# Patient Record
Sex: Female | Born: 1995 | Race: White | Hispanic: No | Marital: Single | Smoking: Never smoker
Health system: Southern US, Community
[De-identification: ages and names within clinical notes are randomized; demographics above are authoritative.]

## PROBLEM LIST (undated history)

## (undated) DIAGNOSIS — F32A Depression, unspecified: Secondary | ICD-10-CM

## (undated) DIAGNOSIS — F419 Anxiety disorder, unspecified: Secondary | ICD-10-CM

## (undated) DIAGNOSIS — F909 Attention-deficit hyperactivity disorder, unspecified type: Secondary | ICD-10-CM

## (undated) DIAGNOSIS — M722 Plantar fascial fibromatosis: Secondary | ICD-10-CM

## (undated) DIAGNOSIS — Z973 Presence of spectacles and contact lenses: Secondary | ICD-10-CM

## (undated) DIAGNOSIS — F319 Bipolar disorder, unspecified: Secondary | ICD-10-CM

## (undated) HISTORY — DX: Presence of spectacles and contact lenses: Z97.3

## (undated) HISTORY — DX: Plantar fascial fibromatosis: M72.2

## (undated) HISTORY — PX: LEG SURGERY: SHX1003

## (undated) HISTORY — DX: Depression, unspecified: F32.A

## (undated) HISTORY — DX: Attention-deficit hyperactivity disorder, unspecified type: F90.9

## (undated) HISTORY — DX: Anxiety disorder, unspecified: F41.9

---
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
## 2017-03-27 ENCOUNTER — Ambulatory Visit: Payer: BC Managed Care – PPO | Admitting: Internal Medicine

## 2017-03-27 ENCOUNTER — Encounter: Payer: Self-pay | Admitting: Internal Medicine

## 2017-03-27 VITALS — BP 124/82 | HR 72 | Temp 98.7°F | Ht 65.5 in | Wt 232.0 lb

## 2017-03-27 DIAGNOSIS — Z3009 Encounter for other general counseling and advice on contraception: Secondary | ICD-10-CM

## 2017-03-27 NOTE — Progress Notes (Signed)
HPI  Pt presents to the clinic today to establish care. She has not had a PCP in many years. She would like to discuss options for birth control today. She is sexually active. Her periods are regular and light. She has no history of clotting disorders. She does not smoke. She has done some research on her own, and would like to know more about the implant.  Flu: never Tetanus: ? 2012 Pap Smear: never Dentist: biannually  Past Medical History:  Diagnosis Date  . Wears glasses     No current outpatient medications on file.   No current facility-administered medications for this visit.     No Known Allergies  Family History  Problem Relation Age of Onset  . Fibrocystic breast disease Mother   . Dementia Paternal Grandmother     Social History   Socioeconomic History  . Marital status: Single    Spouse name: Not on file  . Number of children: Not on file  . Years of education: Not on file  . Highest education level: Not on file  Social Needs  . Financial resource strain: Not on file  . Food insecurity - worry: Not on file  . Food insecurity - inability: Not on file  . Transportation needs - medical: Not on file  . Transportation needs - non-medical: Not on file  Occupational History  . Not on file  Tobacco Use  . Smoking status: Never Smoker  . Smokeless tobacco: Never Used  Substance and Sexual Activity  . Alcohol use: No    Frequency: Never  . Drug use: No  . Sexual activity: Not on file  Other Topics Concern  . Not on file  Social History Narrative  . Not on file    ROS:  Constitutional: Denies fever, malaise, fatigue, headache or abrupt weight changes.  HEENT: Denies eye pain, eye redness, ear pain, ringing in the ears, wax buildup, runny nose, nasal congestion, bloody nose, or sore throat. Respiratory: Denies difficulty breathing, shortness of breath, cough or sputum production.   Cardiovascular: Denies chest pain, chest tightness, palpitations or  swelling in the hands or feet.  Gastrointestinal: Denies abdominal pain, bloating, constipation, diarrhea or blood in the stool.  GU: Denies frequency, urgency, pain with urination, blood in urine, odor or discharge. Musculoskeletal: Denies decrease in range of motion, difficulty with gait, muscle pain or joint pain and swelling.  Skin: Denies redness, rashes, lesions or ulcercations.  Neurological: Denies dizziness, difficulty with memory, difficulty with speech or problems with balance and coordination.  Psych: Denies anxiety, depression, SI/HI.  No other specific complaints in a complete review of systems (except as listed in HPI above).  PE:  BP 124/82   Pulse 72   Temp 98.7 F (37.1 C) (Oral)   Ht 5' 5.5" (1.664 m)   Wt 232 lb (105.2 kg)   LMP 03/11/2017   SpO2 98%   BMI 38.02 kg/m   Wt Readings from Last 3 Encounters:  03/27/17 232 lb (105.2 kg)  07/28/12 191 lb (86.6 kg) (97 %, Z= 1.86)*   * Growth percentiles are based on CDC (Girls, 2-20 Years) data.    General: Appears her stated age, obese, in NAD. Cardiovascular: Normal rate and rhythm. S1,S2 noted.  No murmur, rubs or gallops noted.  Pulmonary/Chest: Normal effort and positive vesicular breath sounds. No respiratory distress. No wheezes, rales or ronchi noted.  Abdomen: Soft and nontender. Normal bowel sounds. No distention or masses noted.  Neurological: Alert and oriented.  Psychiatric: Mood and affect normal. Behavior is normal. Judgment and thought content normal.     Assessment and Plan:  Encounter for Birth Control Counseling:  Discussed OCP's, implant, intravaginal ring, injectables and implants Discussed common side effects of each and how each prevent pregnancy She would like the implant, which we do not do here Referral placed to GYN- you will receive a call to set this up  Advised her to make an appt for her annual exam Nicki Reaper, NP

## 2017-03-27 NOTE — Patient Instructions (Signed)
Etonogestrel implant What is this medicine? ETONOGESTREL (et oh noe JES trel) is a contraceptive (birth control) device. It is used to prevent pregnancy. It can be used for up to 3 years. This medicine may be used for other purposes; ask your health care provider or pharmacist if you have questions. COMMON BRAND NAME(S): Implanon, Nexplanon What should I tell my health care provider before I take this medicine? They need to know if you have any of these conditions: -abnormal vaginal bleeding -blood vessel disease or blood clots -cancer of the breast, cervix, or liver -depression -diabetes -gallbladder disease -headaches -heart disease or recent heart attack -high blood pressure -high cholesterol -kidney disease -liver disease -renal disease -seizures -tobacco smoker -an unusual or allergic reaction to etonogestrel, other hormones, anesthetics or antiseptics, medicines, foods, dyes, or preservatives -pregnant or trying to get pregnant -breast-feeding How should I use this medicine? This device is inserted just under the skin on the inner side of your upper arm by a health care professional. Talk to your pediatrician regarding the use of this medicine in children. Special care may be needed. Overdosage: If you think you have taken too much of this medicine contact a poison control center or emergency room at once. NOTE: This medicine is only for you. Do not share this medicine with others. What if I miss a dose? This does not apply. What may interact with this medicine? Do not take this medicine with any of the following medications: -amprenavir -bosentan -fosamprenavir This medicine may also interact with the following medications: -barbiturate medicines for inducing sleep or treating seizures -certain medicines for fungal infections like ketoconazole and itraconazole -grapefruit juice -griseofulvin -medicines to treat seizures like carbamazepine, felbamate, oxcarbazepine,  phenytoin, topiramate -modafinil -phenylbutazone -rifampin -rufinamide -some medicines to treat HIV infection like atazanavir, indinavir, lopinavir, nelfinavir, tipranavir, ritonavir -St. John's wort This list may not describe all possible interactions. Give your health care provider a list of all the medicines, herbs, non-prescription drugs, or dietary supplements you use. Also tell them if you smoke, drink alcohol, or use illegal drugs. Some items may interact with your medicine. What should I watch for while using this medicine? This product does not protect you against HIV infection (AIDS) or other sexually transmitted diseases. You should be able to feel the implant by pressing your fingertips over the skin where it was inserted. Contact your doctor if you cannot feel the implant, and use a non-hormonal birth control method (such as condoms) until your doctor confirms that the implant is in place. If you feel that the implant may have broken or become bent while in your arm, contact your healthcare provider. What side effects may I notice from receiving this medicine? Side effects that you should report to your doctor or health care professional as soon as possible: -allergic reactions like skin rash, itching or hives, swelling of the face, lips, or tongue -breast lumps -changes in emotions or moods -depressed mood -heavy or prolonged menstrual bleeding -pain, irritation, swelling, or bruising at the insertion site -scar at site of insertion -signs of infection at the insertion site such as fever, and skin redness, pain or discharge -signs of pregnancy -signs and symptoms of a blood clot such as breathing problems; changes in vision; chest pain; severe, sudden headache; pain, swelling, warmth in the leg; trouble speaking; sudden numbness or weakness of the face, arm or leg -signs and symptoms of liver injury like dark yellow or brown urine; general ill feeling or flu-like symptoms;  light-colored   stools; loss of appetite; nausea; right upper belly pain; unusually weak or tired; yellowing of the eyes or skin -unusual vaginal bleeding, discharge -signs and symptoms of a stroke like changes in vision; confusion; trouble speaking or understanding; severe headaches; sudden numbness or weakness of the face, arm or leg; trouble walking; dizziness; loss of balance or coordination Side effects that usually do not require medical attention (report to your doctor or health care professional if they continue or are bothersome): -acne -back pain -breast pain -changes in weight -dizziness -general ill feeling or flu-like symptoms -headache -irregular menstrual bleeding -nausea -sore throat -vaginal irritation or inflammation This list may not describe all possible side effects. Call your doctor for medical advice about side effects. You may report side effects to FDA at 1-800-FDA-1088. Where should I keep my medicine? This drug is given in a hospital or clinic and will not be stored at home. NOTE: This sheet is a summary. It may not cover all possible information. If you have questions about this medicine, talk to your doctor, pharmacist, or health care provider.  2018 Elsevier/Gold Standard (2015-08-31 11:19:22)  

## 2017-04-01 ENCOUNTER — Telehealth: Payer: Self-pay | Admitting: Radiology

## 2017-04-01 NOTE — Telephone Encounter (Signed)
Left message on the cell phone voicemail to call [facility] to schedule appointment from referral.

## 2017-04-15 ENCOUNTER — Encounter: Payer: Self-pay | Admitting: Obstetrics and Gynecology

## 2017-04-15 ENCOUNTER — Ambulatory Visit: Payer: BC Managed Care – PPO | Admitting: Obstetrics and Gynecology

## 2017-04-15 VITALS — BP 111/72 | HR 72 | Wt 233.0 lb

## 2017-04-15 DIAGNOSIS — Z3046 Encounter for surveillance of implantable subdermal contraceptive: Secondary | ICD-10-CM | POA: Diagnosis not present

## 2017-04-15 DIAGNOSIS — Z124 Encounter for screening for malignant neoplasm of cervix: Secondary | ICD-10-CM

## 2017-04-15 DIAGNOSIS — Z01419 Encounter for gynecological examination (general) (routine) without abnormal findings: Secondary | ICD-10-CM

## 2017-04-15 DIAGNOSIS — E669 Obesity, unspecified: Secondary | ICD-10-CM

## 2017-04-15 DIAGNOSIS — Z3202 Encounter for pregnancy test, result negative: Secondary | ICD-10-CM

## 2017-04-15 DIAGNOSIS — Z1151 Encounter for screening for human papillomavirus (HPV): Secondary | ICD-10-CM | POA: Diagnosis not present

## 2017-04-15 DIAGNOSIS — Z113 Encounter for screening for infections with a predominantly sexual mode of transmission: Secondary | ICD-10-CM

## 2017-04-15 LAB — POCT URINE PREGNANCY: Preg Test, Ur: NEGATIVE

## 2017-04-15 MED ORDER — ETONOGESTREL 68 MG ~~LOC~~ IMPL
68.0000 mg | DRUG_IMPLANT | Freq: Once | SUBCUTANEOUS | Status: AC
  Administered 2017-04-15: 68 mg via SUBCUTANEOUS

## 2017-04-15 NOTE — Progress Notes (Signed)
sti testing.

## 2017-04-15 NOTE — Progress Notes (Signed)
Obstetrics and Gynecology New Patient Evaluation  Appointment Date: 04/15/2017  [facility]  Primary Care Provider: Lorre MunroeBaity, Regina W  Referring Provider: Lorre MunroeBaity, Regina W, NP  Chief Complaint:  Chief Complaint  Patient presents with  . Gynecologic Exam  desire for nexplanon  History of Present Illness: Glenda Rich is a 22 y.o. Caucasian G0 (Patient's last menstrual period was 04/09/2017.), seen for the above chief complaint. Her past medical history is significant for BMI 30s.   No breast s/s, fevers, chills, chest pain, SOB, nausea, vomiting, abdominal pain, dysuria, hematuria, vaginal itching, dyspareunia, diarrhea, constipation  Review of Systems: as noted in the History of Present Illness.  Patient Active Problem List   Diagnosis Date Noted  . Obesity (BMI 30-39.9) 04/15/2017     Past Medical History:  Past Medical History:  Diagnosis Date  . Wears glasses     Past Surgical History:  No past surgical history on file.  Past Obstetrical History:  OB History  Gravida Para Term Preterm AB Living  0 0 0 0 0 0  SAB TAB Ectopic Multiple Live Births  0 0 0 0 0       Past Gynecological History: As per HPI. Periods: 3d regular, no intermenstrual bleeding. Heavy but not painful She has never been on anything for birth control or period control except for condoms History of Pap Smear(s): No. History of STI(s): No. She is currently using condoms for contraception.   Social History:  Social History   Socioeconomic History  . Marital status: Single    Spouse name: Not on file  . Number of children: Not on file  . Years of education: Not on file  . Highest education level: Not on file  Social Needs  . Financial resource strain: Not on file  . Food insecurity - worry: Not on file  . Food insecurity - inability: Not on file  . Transportation needs - medical: Not on file  . Transportation needs - non-medical: Not on  file  Occupational History  . Not on file  Tobacco Use  . Smoking status: Never Smoker  . Smokeless tobacco: Never Used  Substance and Sexual Activity  . Alcohol use: No    Frequency: Never  . Drug use: No  . Sexual activity: Not on file  Other Topics Concern  . Not on file  Social History Narrative  . Not on file    Family History:  Family History  Problem Relation Age of Onset  . Fibrocystic breast disease Mother   . Dementia Paternal Grandmother    She denies any female cancers, bleeding or blood clotting disorders  Medications Glenda MoraleKacey M. Strey had no medications administered during this visit. No current outpatient medications on file.   No current facility-administered medications for this visit.     Allergies Patient has no known allergies.   Physical Exam:  BP 111/72   Pulse 72   Wt 233 lb (105.7 kg)   LMP 04/09/2017   BMI 38.18 kg/m  Body mass index is 38.18 kg/m. General appearance: Well nourished, well developed female in no acute distress.  Neck:  Supple, normal appearance, and no thyromegaly  Cardiovascular: normal s1 and s2.  No murmurs, rubs or gallops. Respiratory:  Clear to auscultation bilateral. Normal respiratory effort Abdomen: positive bowel sounds and no masses, hernias; diffusely non tender to palpation, non distended Neuro/Psych:  Normal mood and affect.  Skin:  Warm and dry.  Lymphatic:  No inguinal  lymphadenopathy.   Pelvic exam: is not limited by body habitus EGBUS: within normal limits, Vagina: within normal limits and with no blood or discharge in the vault, Cervix: normal appearing cervix without tenderness, discharge or lesions. Uterus:  nonenlarged and non tender and Adnexa:  normal adnexa and no mass, fullness, tenderness Rectovaginal: deferred  Laboratory: UPT negative  Radiology: none  Assessment: pt doing well  Plan:  1. Encounter for gynecological examination Routine care. Pt would like nexplanon after d/w her.  Information on HPV vaccine given to patient. Pt declines serum STI testing; d/w her nexplanon only against protection and condoms or abstinence for STIs.   See procedure note for uncomplicated nexplanon.   - Cytology - PAP  RTC 1 year  Cornelia Copa MD Attending Center [facility])

## 2017-04-15 NOTE — Procedures (Signed)
Nexplanon Insertion Procedure Note Prior to the procedure being performed, the patient (or guardian) was asked to state their full name, date of birth, type of procedure being performed and the exact location of the operative site. This information was then checked against the documentation in the patient's chart. Prior to the procedure being performed, a "time out" was performed by the physician that confirmed the correct patient, procedure and site. UPT negative and no sexual intercourse after LMP.   After informed consent was obtained, the patient's  left arm was chosen for insertion. A site was marked approximately 8 cm proximal to the medial epicondyle in the sulcus between the biceps and triceps on the inner surface. The area was cleaned with alcohol then local anesthesia was infiltrated with 3 ml of 1% lidocaine along the planned insertion track. The area was prepped with betadine. Using sterile technique the Nexplanon device was inserted per manufacturer's guidelines in the subdermal connective tissue using the standard insertion technique without difficulty. Pressure was applied and the insertion site was hemostatic. The presence of the Nexplanon was confirmed immediately after insertion by palpation by both me and the patient and by checking the tip of needle for the absence of the insert.  A pressure dressing was applied.  The patient tolerated the procedure well.  Cornelia Copaharlie Valla Pacey, Jr MD Attending [facility])

## 2017-04-17 LAB — CYTOLOGY - PAP
Chlamydia: NEGATIVE
Diagnosis: NEGATIVE
Neisseria Gonorrhea: NEGATIVE
Trichomonas: NEGATIVE

## 2019-01-25 ENCOUNTER — Ambulatory Visit: Payer: BC Managed Care – PPO | Admitting: Podiatry

## 2019-01-29 ENCOUNTER — Encounter: Payer: Self-pay | Admitting: Podiatry

## 2019-01-29 ENCOUNTER — Ambulatory Visit: Payer: BC Managed Care – PPO | Admitting: Podiatry

## 2019-01-29 ENCOUNTER — Other Ambulatory Visit: Payer: Self-pay

## 2019-01-29 ENCOUNTER — Ambulatory Visit (INDEPENDENT_AMBULATORY_CARE_PROVIDER_SITE_OTHER): Payer: BC Managed Care – PPO

## 2019-01-29 VITALS — BP 127/75 | HR 76 | Resp 16

## 2019-01-29 DIAGNOSIS — M722 Plantar fascial fibromatosis: Secondary | ICD-10-CM

## 2019-01-29 MED ORDER — DICLOFENAC SODIUM 75 MG PO TBEC
75.0000 mg | DELAYED_RELEASE_TABLET | Freq: Two times a day (BID) | ORAL | 2 refills | Status: DC
Start: 2019-01-29 — End: 2019-09-06

## 2019-01-29 NOTE — Patient Instructions (Signed)

## 2019-02-01 NOTE — Progress Notes (Signed)
Subjective:   Patient ID: Glenda Rich, female   DOB: 23 y.o.   MRN: [id number]   HPI Patient presents stating she works on cement floors and has had problems with her heels both feet with the right being worse over the last few months.  States that it is bad when she gets up in the morning after periods of sitting and patient does not smoke likes to be active   Review of Systems  All other systems reviewed and are negative.       Objective:  Physical Exam Vitals signs and nursing note reviewed.  Constitutional:      Appearance: She is well-developed.  Pulmonary:     Effort: Pulmonary effort is normal.  Musculoskeletal: Normal range of motion.  Skin:    General: Skin is warm.  Neurological:     Mental Status: She is alert.     Neurovascular status intact muscle strength found to be adequate range of motion within normal limits with exquisite discomfort medial band heel right over left with inflammation fluid of the medial band at the insertional point tendon calcaneus with moderate obesity noted and moderate depression of the arch noted bilateral     Assessment:  Acute plantar fasciitis right over left with inflammation fluid buildup with obesity part of the complicating factor     Plan:  H&P condition reviewed and today I did sterile prep and injected the plantar fascial bilateral 3 mg Kenalog 5 mg Xylocaine and applied fascial brace right.  Placed on diclofenac 75 mg twice daily reappoint in 2 weeks  X-rays indicate that there is small spur no indication to stress fracture arthritis

## 2019-02-12 ENCOUNTER — Ambulatory Visit (INDEPENDENT_AMBULATORY_CARE_PROVIDER_SITE_OTHER): Payer: BC Managed Care – PPO | Admitting: Podiatry

## 2019-02-12 ENCOUNTER — Encounter: Payer: Self-pay | Admitting: Podiatry

## 2019-02-12 ENCOUNTER — Other Ambulatory Visit: Payer: Self-pay

## 2019-02-12 DIAGNOSIS — M722 Plantar fascial fibromatosis: Secondary | ICD-10-CM

## 2019-02-15 NOTE — Progress Notes (Signed)
Subjective:   Patient ID: Glenda Rich, female   DOB: 23 y.o.   MRN: [id number]   HPI Patient was concerned that she felt a snapping feeling in her right arch heel and into her ankle and wants to make sure nothing happened back.  States is feeling better now   ROS      Objective:  Physical Exam  Neurovascular status intact with mild discomfort around the ankle region right but it is not localized and I did not note any significant pathology besides that with no indications currently of fracture     Assessment:  Most likely an inflammatory condition with no current indications of anything advanced     Plan:  Reviewed condition and recommended continued conservative treatment patient is discharged will be seen back as needed

## 2019-03-15 ENCOUNTER — Encounter: Payer: Self-pay | Admitting: Podiatry

## 2019-03-15 ENCOUNTER — Other Ambulatory Visit: Payer: Self-pay

## 2019-03-15 ENCOUNTER — Ambulatory Visit: Payer: BC Managed Care – PPO | Admitting: Podiatry

## 2019-03-15 DIAGNOSIS — T148XXA Other injury of unspecified body region, initial encounter: Secondary | ICD-10-CM

## 2019-03-15 DIAGNOSIS — M722 Plantar fascial fibromatosis: Secondary | ICD-10-CM | POA: Diagnosis not present

## 2019-03-15 NOTE — Progress Notes (Signed)
Subjective:   Patient ID: Glenda Rich, female   DOB: 24 y.o.   MRN: [id number]   HPI Pain states she felt a pop in her right heel when she lifted up on her toes and its been very sore since.  Patient stated this happened about 6 days ago   ROS      Objective:  Physical Exam  Neurovascular status intact with what appears to be a tear of the medial fascial band of the right heel secondary to the injury she sustained from activity that she chose to do.  It is very painful when pressed     Assessment:  Probability for partial rupture of the plantar fascial right secondary to activity     Plan:  H&P reviewed the heel and compared to their foot noting that there is a diminishment of the thickness of the fascia.  I went ahead and applied air fracture walker instructed on ice therapy and reduced activities and she will wear this for approximate 3 weeks and total recovery will probably take 8 to 12 weeks.  Patient will be seen back to recheck

## 2019-05-03 ENCOUNTER — Telehealth: Payer: Self-pay | Admitting: Podiatry

## 2019-05-03 ENCOUNTER — Ambulatory Visit (INDEPENDENT_AMBULATORY_CARE_PROVIDER_SITE_OTHER): Payer: BC Managed Care – PPO | Admitting: Podiatry

## 2019-05-03 ENCOUNTER — Other Ambulatory Visit: Payer: Self-pay

## 2019-05-03 VITALS — Temp 97.3°F

## 2019-05-03 DIAGNOSIS — M722 Plantar fascial fibromatosis: Secondary | ICD-10-CM | POA: Diagnosis not present

## 2019-05-03 NOTE — Telephone Encounter (Signed)
I'm calling to schedule my sx with Dr. Charlsie Merles. If possible, I would like to do it on 03/23. I told pt to go ahead and register online with [facility] via [employer]. I told her I would send her signed consent forms to the sx center. I also told her they would call her a day or two prior and let her know what time to arrive for her sx. Told her to call me with any questions between now and sx.

## 2019-05-03 NOTE — Patient Instructions (Signed)
Pre-Operative Instructions  Congratulations, you have decided to take an important step towards improving your quality of life.  You can be assured that the doctors and staff at [facility] will be with you every step of the way.  Here are some important things you should know:  1. Plan to be at the surgery center/hospital at least 1 (one) hour prior to your scheduled time, unless otherwise directed by the surgical center/hospital staff.  You must have a responsible adult accompany you, remain during the surgery and drive you home.  Make sure you have directions to the surgical center/hospital to ensure you arrive on time. 2. If you are having surgery at [facility] or [facility], you will need a copy of your medical history and physical form from your family physician within one month prior to the date of surgery. We will give you a form for your primary physician to complete.  3. We make every effort to accommodate the date you request for surgery.  However, there are times where surgery dates or times have to be moved.  We will contact you as soon as possible if a change in schedule is required.   4. No aspirin/ibuprofen for one week before surgery.  If you are on aspirin, any non-steroidal anti-inflammatory medications (Mobic, Aleve, Ibuprofen) should not be taken seven (7) days prior to your surgery.  You make take Tylenol for pain prior to surgery.  5. Medications - If you are taking daily heart and blood pressure medications, seizure, reflux, allergy, asthma, anxiety, pain or diabetes medications, make sure you notify the surgery center/hospital before the day of surgery so they can tell you which medications you should take or avoid the day of surgery. 6. No food or drink after midnight the night before surgery unless directed otherwise by surgical center/hospital staff. 7. No alcoholic beverages 24-hours prior to surgery.  No smoking 24-hours prior or 24-hours after  surgery. 8. Wear loose pants or shorts. They should be loose enough to fit over bandages, boots, and casts. 9. Don't wear slip-on shoes. Sneakers are preferred. 10. Bring your boot with you to the surgery center/hospital.  Also bring crutches or a walker if your physician has prescribed it for you.  If you do not have this equipment, it will be provided for you after surgery. 11. If you have not been contacted by the surgery center/hospital by the day before your surgery, call to confirm the date and time of your surgery. 12. Leave-time from work may vary depending on the type of surgery you have.  Appropriate arrangements should be made prior to surgery with your employer. 13. Prescriptions will be provided immediately following surgery by your doctor.  Fill these as soon as possible after surgery and take the medication as directed. Pain medications will not be refilled on weekends and must be approved by the doctor. 14. Remove nail polish on the operative foot and avoid getting pedicures prior to surgery. 15. Wash the night before surgery.  The night before surgery wash the foot and leg well with water and the antibacterial soap provided. Be sure to pay special attention to beneath the toenails and in between the toes.  Wash for at least three (3) minutes. Rinse thoroughly with water and dry well with a towel.  Perform this wash unless told not to do so by your physician.  Enclosed: 1 Ice pack (please put in freezer the night before surgery)   1 Hibiclens skin cleaner     Pre-op instructions  If you have any questions regarding the instructions, please do not hesitate to call our office.  [place]: [address], [place], [place] [address] -- [phone]  [place]: [address] [place], [place] [address] -- [phone]  [place]: [address], [place], [place] [address] -- [phone]   Website: [email or web address] 

## 2019-05-03 NOTE — Progress Notes (Signed)
Subjective:   Patient ID: Glenda Rich, female   DOB: 24 y.o.   MRN: [id number]   HPI Patient states my heel is still killing me on my right heel and making it hard for me to be active.  Patient states the left where she had the tear is doing better but the right 1 remains debilitating and difficult for her to be ambulatory with.  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  I recommended combination of endoscopic surgery along with gastroc lengthening and I reviewed procedures risk and patient wants surgery.  I went ahead today and I allowed her to read consent form and after extensive review she signed and patient is scheduled for outpatient surgery.  Patient is encouraged to call with questions concerns understanding total recovery can take 6 months to 1 year there is no long-term guarantees and other pains can develop.  She is willing to accept this risk and is scheduled for outpatient surgery and is encouraged to call with questions concerns which may occur

## 2019-05-04 ENCOUNTER — Telehealth: Payer: Self-pay | Admitting: Podiatry

## 2019-05-04 NOTE — Telephone Encounter (Signed)
DOS: 05/18/2019  SURGICAL PROCEDURES: Endoscopic Plantar Fasciotomy Med Band [phone]) and Gastrocnemius Recess [phone]).  BCBS Policy Effective : 02/26/2019  -  02/24/9998  Member Liability Summary  In-Network   Max Per Benefit Period Year-to-Date Remaining     CoInsurance 20%      Deductible  $1250.00 $1250.00     Out-Of-Pocket $4890.00 $4806.63  Hospital - Ambulatory Surgical AMBULATORY SURGERY  In Network Copay   Coinsurance Not Applicable  20%  per  SERVICE YEAR

## 2019-05-07 ENCOUNTER — Telehealth: Payer: Self-pay | Admitting: Podiatry

## 2019-05-07 NOTE — Telephone Encounter (Signed)
I was calling to see if there is a time yet for my surgery. I told the pt she would get a call the Friday or Monday before her sx letting her know what time to arrive. Told pt it depends on if they have any schedule changes, or pt's that are diabetic or pediatric that need to go back first. Told pt to call me with any other questions she may have prior to her sx.

## 2019-05-17 MED ORDER — HYDROCODONE-ACETAMINOPHEN 10-325 MG PO TABS: 1.0000 | ORAL_TABLET | Freq: Three times a day (TID) | ORAL | 0 refills | Status: AC | PRN

## 2019-05-17 NOTE — Telephone Encounter (Signed)
I still haven't received a time for my surgery scheduled for tomorrow. I told the pt that the sx center had closed early on Friday and they should be calling sometime today, but I offered her their phone number for her to call and get her time from them. Wished her well with her sx tomorrow.

## 2019-05-17 NOTE — Addendum Note (Signed)
Addended by: Lenn Sink on: 05/17/2019 05:27 PM   Modules accepted: Orders

## 2019-05-18 ENCOUNTER — Encounter: Payer: Self-pay | Admitting: Podiatry

## 2019-05-18 DIAGNOSIS — M216X1 Other acquired deformities of right foot: Secondary | ICD-10-CM | POA: Diagnosis not present

## 2019-05-18 DIAGNOSIS — M722 Plantar fascial fibromatosis: Secondary | ICD-10-CM | POA: Diagnosis not present

## 2019-05-24 ENCOUNTER — Encounter: Payer: Self-pay | Admitting: Podiatry

## 2019-05-24 ENCOUNTER — Other Ambulatory Visit: Payer: Self-pay

## 2019-05-24 ENCOUNTER — Ambulatory Visit (INDEPENDENT_AMBULATORY_CARE_PROVIDER_SITE_OTHER): Payer: BC Managed Care – PPO | Admitting: Podiatry

## 2019-05-24 VITALS — BP 93/72 | HR 73 | Temp 98.2°F | Resp 16

## 2019-05-24 DIAGNOSIS — M722 Plantar fascial fibromatosis: Secondary | ICD-10-CM | POA: Diagnosis not present

## 2019-05-24 DIAGNOSIS — M216X1 Other acquired deformities of right foot: Secondary | ICD-10-CM

## 2019-05-26 NOTE — Progress Notes (Signed)
Subjective:   Patient ID: Glenda Rich, female   DOB: 24 y.o.   MRN: [id number]   HPI Patient states having minimal pain and so far doing well with surgery and my pain on the bottom seems completely better   ROS      Objective:  Physical Exam  Neurovascular status intact negative Denna Haggard' sign noted with patient's right heel healing well wound edges well coapted right gastroc healing well wound edges well coapted with good motion and minimal discomfort noted currently     Assessment:  Doing well post endoscopic surgery gastroc release     Plan:  H&P reviewed conditions and reapplied sterile dressings and advised to continue boot usage and continued weightbearing but being easy on the foot and reappoint 2 weeks suture removal or earlier if needed

## 2019-06-14 ENCOUNTER — Encounter: Payer: BC Managed Care – PPO | Admitting: Podiatry

## 2019-06-18 ENCOUNTER — Ambulatory Visit (INDEPENDENT_AMBULATORY_CARE_PROVIDER_SITE_OTHER): Payer: BC Managed Care – PPO | Admitting: Podiatry

## 2019-06-18 ENCOUNTER — Other Ambulatory Visit: Payer: Self-pay

## 2019-06-18 ENCOUNTER — Encounter: Payer: Self-pay | Admitting: Podiatry

## 2019-06-18 DIAGNOSIS — M216X1 Other acquired deformities of right foot: Secondary | ICD-10-CM

## 2019-06-18 DIAGNOSIS — M722 Plantar fascial fibromatosis: Secondary | ICD-10-CM

## 2019-06-21 ENCOUNTER — Telehealth: Payer: Self-pay | Admitting: Podiatry

## 2019-06-21 NOTE — Telephone Encounter (Signed)
Patient would like to know when she is cleared to go back to work, and would like to have note faxed over to her job. Patient will callback with fax number.

## 2019-06-21 NOTE — Progress Notes (Signed)
Subjective:   Patient ID: Glenda Rich, female   DOB: 24 y.o.   MRN: [id number]   HPI Patient states doing well with the right foot stating so far she is pleased in the plantar heel pain is resolving but she does have quite a bit of swelling in her foot and calf where we did the surgery on the gastroc tendon   ROS      Objective:  Physical Exam  Neurovascular status intact with a negative Denna Haggard' sign noted with patient found to have well-healed surgical site plantar heel bilateral and the posterior leg with stitches intact and mild swelling +1 pitting in the ankle and into the right lower leg     Assessment:  Overall doing well with moderate increase in swelling stitches intact     Plan:  H&P condition reviewed stitches removed at this time.  To try to help with the swelling process I did applied Unna boot to the right midfoot ankle and lower leg and Ace wrap on top and instructed on elevation with hopefully leaving this on for 3 days and then removal.  Patient will be seen back to recheck 3 weeks or earlier if any issues were to occur and was strongly encouraged to let us know if any changes occur

## 2019-06-22 NOTE — Telephone Encounter (Signed)
Angela:  Dr. Charlsie Merles needs to tell you when patient can get cleared to go back to work.  Thank you, Olegario Messier

## 2019-07-28 NOTE — Telephone Encounter (Signed)
She can go whenever she feels ready

## 2019-08-06 ENCOUNTER — Encounter: Payer: Self-pay | Admitting: Podiatry

## 2019-08-06 ENCOUNTER — Ambulatory Visit: Payer: BC Managed Care – PPO | Admitting: Podiatry

## 2019-08-06 ENCOUNTER — Other Ambulatory Visit: Payer: Self-pay

## 2019-08-06 ENCOUNTER — Ambulatory Visit (INDEPENDENT_AMBULATORY_CARE_PROVIDER_SITE_OTHER): Payer: BC Managed Care – PPO

## 2019-08-06 VITALS — Temp 98.1°F

## 2019-08-06 DIAGNOSIS — M722 Plantar fascial fibromatosis: Secondary | ICD-10-CM | POA: Diagnosis not present

## 2019-08-08 NOTE — Progress Notes (Signed)
Subjective:   Patient ID: [name], female   DOB: 24 y.o.   MRN: [id number]   HPI Patient states overall she is doing well and she feels like her foot is doing very well but at times she feels an unusual sensation but she is not getting the bottom of the heel pain right.  The left is quite sore in the bottom of the heel and she knows she will probably need surgery on that 1   ROS      Objective:  Physical Exam  Neurovascular status intact negative Denna Haggard' sign noted with patient's plantar heel right healing well and the gastroc doing very well with increased dorsiflexion of the foot.  There is mild discomfort but nothing severe and I did not note any area of significant pathology.  The left shows quite a bit of discomfort plantar heel and severe equinus condition     Assessment:  Appears to be doing well right with the left showing chronic plantar fasciitis equinus     Plan:  H&P reviewed conditions and for the right she will continue with elevation compression as needed gradual return to normal shoe gear and for the left we discussed endoscopic release with probability for gastroc lengthening.  I explained procedure risk she does want to do it needs to look at schedule and decide when will be best To schedule

## 2019-09-06 ENCOUNTER — Ambulatory Visit: Payer: BC Managed Care – PPO | Admitting: Internal Medicine

## 2019-09-06 ENCOUNTER — Encounter: Payer: Self-pay | Admitting: Internal Medicine

## 2019-09-06 ENCOUNTER — Other Ambulatory Visit: Payer: Self-pay

## 2019-09-06 VITALS — BP 120/82 | HR 100 | Temp 98.1°F | Wt 272.0 lb

## 2019-09-06 DIAGNOSIS — F39 Unspecified mood [affective] disorder: Secondary | ICD-10-CM

## 2019-09-06 DIAGNOSIS — F424 Excoriation (skin-picking) disorder: Secondary | ICD-10-CM

## 2019-09-06 NOTE — Progress Notes (Signed)
Subjective:    Patient ID: Glenda Rich, female    DOB: 1995-06-15, 24 y.o.   MRN: [id number]  HPI  Pt presents to the clinic today requesting referral fto psychiatry. She reports over the last 3-4 years an increase mood swings, intermittent depression and anxiety. She feels like her anxiety is worse in crowds. She feels fidgety even at home and has noticed that she picks at her fingernails. She is having difficulty concentrating on even simple takes and making careless mistakes. She is not sure what is triggering this. She reports family history of depression, schizophrenia. She thinks she may have had some audio hallucinations but not visual hallucinations. She denies SI/HI but did used to cut in high school. She is not currently seeing a therapist. She denies SI/HI.  Review of Systems      Past Medical History:  Diagnosis Date  . Wears glasses     Current Outpatient Medications  Medication Sig Dispense Refill  . acetaminophen (TYLENOL) 500 MG tablet Take 500 mg by mouth every 6 (six) hours as needed.    . diclofenac (VOLTAREN) 75 MG EC tablet Take 1 tablet (75 mg total) by mouth 2 (two) times daily. 50 tablet 2   No current facility-administered medications for this visit.    No Known Allergies  Family History  Problem Relation Age of Onset  . Fibrocystic breast disease Mother   . Dementia Paternal Grandmother     Social History   Socioeconomic History  . Marital status: Single    Spouse name: Not on file  . Number of children: Not on file  . Years of education: Not on file  . Highest education level: Not on file  Occupational History  . Not on file  Tobacco Use  . Smoking status: Never Smoker  . Smokeless tobacco: Never Used  Substance and Sexual Activity  . Alcohol use: No  . Drug use: No  . Sexual activity: Not on file  Other Topics Concern  . Not on file  Social History Narrative  . Not on file   Social Determinants of Health   Financial Resource  Strain:   . Difficulty of Paying Living Expenses:   Food Insecurity:   . Worried About Programme researcher, broadcasting/film/video in the Last Year:   . Barista in the Last Year:   Transportation Needs:   . Freight forwarder (Medical):   Marland Kitchen Lack of Transportation (Non-Medical):   Physical Activity:   . Days of Exercise per Week:   . Minutes of Exercise per Session:   Stress:   . Feeling of Stress :   Social Connections:   . Frequency of Communication with Friends and Family:   . Frequency of Social Gatherings with Friends and Family:   . Attends Religious Services:   . Active Member of Clubs or Organizations:   . Attends Banker Meetings:   Marland Kitchen Marital Status:   Intimate Partner Violence:   . Fear of Current or Ex-Partner:   . Emotionally Abused:   Marland Kitchen Physically Abused:   . Sexually Abused:      Constitutional: Denies fever, malaise, fatigue, headache or abrupt weight changes.  Respiratory: Denies difficulty breathing, shortness of breath, cough or sputum production.   Cardiovascular: Denies chest pain, chest tightness, palpitations or swelling in the hands or feet.  Skin: Pt reports skin picking. Denies redness, rashes, lesions or ulcercations.  Neurological: Pt reports difficulty concentrating. Denies dizziness, difficulty with memory, difficulty  with speech or problems with balance and coordination.  Psych: Pt reports anxiety and depression. Denies SI/HI.  No other specific complaints in a complete review of systems (except as listed in HPI above).  Objective:   Physical Exam  BP 120/82   Pulse 100   Temp 98.1 F (36.7 C) (Temporal)   Wt 272 lb (123.4 kg)   SpO2 98%   BMI 44.57 kg/m   Wt Readings from Last 3 Encounters:  04/15/17 233 lb (105.7 kg)  03/27/17 232 lb (105.2 kg)  07/28/12 191 lb (86.6 kg) (97 %, Z= 1.86)*   * Growth percentiles are based on CDC (Girls, 2-20 Years) data.    General: Appears her stated age, obese, in NAD. HEENT: Head: normal shape  and size; Eyes: sclera white, no icterus, conjunctiva pink, PERRLA and EOMs intact;  Cardiovascular: Normal rate and rhythm. S1,S2 noted.  No murmur, rubs or gallops noted. Pulmonary/Chest: Normal effort and positive vesicular breath sounds. No respiratory distress. No wheezes, rales or ronchi noted.  Neurological: Alert and oriented.  Psychiatric: Mood and affect mildly flat. Behavior is normal. Judgment and thought content normal.          Assessment & Plan:   Mood Disorder, Skin Picking Disorder:  DDX include anxiety, depression, ADD Referral to psychiatry placed for further evaluation  Return precautions discussed Nicki Reaper, NP This visit occurred during the SARS-CoV-2 public health emergency.  Safety protocols were in place, including screening questions prior to the visit, additional usage of staff PPE, and extensive cleaning of exam room while observing appropriate contact time as indicated for disinfecting solutions.  '

## 2019-09-06 NOTE — Patient Instructions (Signed)
Social Anxiety Disorder, Adult Social anxiety disorder (SAD), previously called social phobia, is a mental health condition. People with SAD often feel nervous, afraid, or embarrassed when they are around other people in social situations. They worry that other people are judging or criticizing them for how they look, what they say, or how they act. SAD involves more than just feeling shy or self-conscious at times. It can cause severe emotional distress. It can interfere with activities of daily life. SAD also may lead to alcohol or drug use, and even suicide. SAD is a common mental health condition. It can develop at any time, but it usually starts in the teenage years. What are the causes? The cause of this condition is not known. It may involve genes that are passed through families. Stressful events may trigger anxiety. This disorder is also associated with an overactive amygdala. The amygdala is the part of the brain that triggers your response to strong feelings, such as fear. What increases the risk? This condition is more likely to develop in:  People who have a family history of anxiety disorders.  Women.  People who have a physical or behavioral condition that makes them feel self-conscious or nervous, such as a stutter or a long-term (chronic) disease. What are the signs or symptoms? The main symptom of this condition is fear of embarrassment caused by being criticized or judged in social situations. You may be afraid to:  Speak in public.  Go shopping.  Use a public bathroom.  Eat at a restaurant.  Go to work.  Interact with people you do not know. Extreme fear and anxiety may cause physical symptoms, including:  Blushing.  A fast heartbeat.  Sweating.  Shaky hands or voice.  Confusion.  Light-headedness.  Upset stomach, diarrhea, or vomiting.  Shortness of breath. How is this diagnosed? This condition is diagnosed based on your history, symptoms, and  behavior in social situations. You may be diagnosed with this type of anxiety if your symptoms have lasted for more than 6 months and have been present on more days than not. Your health care provider may ask you about your use of alcohol, drugs, and prescription medicines. He or she may also refer you to a mental health specialist for further evaluation or treatment. How is this treated? Treatment for this condition may include:  Cognitive behavioral therapy (CBT). This type of talk therapy helps you learn to replace negative thoughts and behaviors with positive ones. This may include learning how to use self-calming skills and other methods of managing your anxiety.  Exposure therapy. You will be exposed to social situations that cause you fear. The treatment starts with practicing self-calming in situations that cause you low levels of fear. Over time, you will progress by sustaining self-calming and managing harder situations.  Antidepressant medicines. These medicines may be used by themselves or in addition to other therapies.  Biofeedback. This process trains you to manage your body's response (physiological response) through breathing techniques and relaxation methods. You will work with a therapist while machines are used to monitor your physical symptoms.  Techniques for relaxation and managing anxiety. These include deep breathing, self-talk, meditation, visual imagery, muscle relaxation, music therapy, and yoga. These techniques are often used with other therapies to keep you calm in situations that cause you anxiety. These treatments are often used in combination. Follow these instructions at home: Alcohol use If you drink alcohol:  Limit how much you use to: ? 0-1 drink a day for   nonpregnant women. ? 0-2 drinks a day for men.  Be aware of how much alcohol is in your drink. In the [place] one drink equals one 12 oz bottle of beer (355 mL), one 5 oz glass of wine (148 mL), or one 1  oz glass of hard liquor (44 mL). General instructions  Take over-the-counter and prescription medicines only as told by your health care provider.  Practice techniques for relaxation and managing anxiety at times you are not challenged by social anxiety.  Return to social activities using techniques you have learned, as you feel ready to do so.  Avoid caffeine and certain over-the-counter cold medicines. These may make you feel worse. Ask your pharmacists which medicines to avoid.  Keep all follow-up visits as told by your health care provider. This is important. Where to find more information  National Alliance on Mental Illness (NAMI): https://www.nami.org  Social Anxiety Association: https://socialphobia.org  Mental Health America (MHA): https://www.mhanational.org  Anxiety and Depression Association of America (ADAA): https://adaa.org/ Contact a health care provider if:  Your symptoms do not improve or get worse.  You have signs of depression, such as: ? Persistent sadness or moodiness. ? Loss of enjoyment in activities that used to bring you joy. ? Change in weight or eating. ? Changes in sleeping habits. ? Avoiding friends or family members more than usual. ? Loss of energy for normal tasks. ? Feeling guilty or worthless.  You become more isolated than you normally are.  You find it more and more difficult to speak or interact with others.  You are using drugs.  You are drinking more alcohol than usual. Get help right away if:  You harm yourself.  You have suicidal thoughts. If you ever feel like you may hurt yourself or others, or have thoughts about taking your own life, get help right away. You can go to your nearest emergency department or call:  Your local emergency services (911 in the [place]  A suicide crisis helpline, such as the National Suicide Prevention Lifeline at 1-800-273-8255. This is open 24 hours a day. Summary  Social anxiety disorder  (SAD) may cause you to feel nervous, afraid, or embarrassed when you are around other people in social situations.  SAD is a common mental disorder. It can develop at any time, but it usually starts in the teenage years.  Treatment includes talk therapy, exposure therapy, medicines, biofeedback, and relaxation techniques. It can involve a combination of treatments. This information is not intended to replace advice given to you by your health care provider. Make sure you discuss any questions you have with your health care provider. Document Revised: 07/15/2018 Document Reviewed: 07/15/2018 Elsevier Patient Education  2020 Elsevier Inc.  

## 2019-10-04 ENCOUNTER — Other Ambulatory Visit: Payer: Self-pay

## 2019-10-04 ENCOUNTER — Encounter: Payer: Self-pay | Admitting: Podiatry

## 2019-10-04 ENCOUNTER — Ambulatory Visit: Payer: BC Managed Care – PPO | Admitting: Podiatry

## 2019-10-04 VITALS — Temp 97.7°F

## 2019-10-04 DIAGNOSIS — M722 Plantar fascial fibromatosis: Secondary | ICD-10-CM | POA: Diagnosis not present

## 2019-10-04 NOTE — Progress Notes (Signed)
Subjective:   Patient ID: Glenda Rich, female   DOB: 24 y.o.   MRN: [id number]   HPI Patient states doing real well with right foot and ready to get my left fixed   ROS      Objective:  Physical Exam  Neurovascular status intact negative Denna Haggard' sign right wound edges well coapted significant reduction of equinus condition with no plantar pain with exquisite discomfort plantar heel left and equinus condition left     Assessment:  Doing very well post surgery right with with acute plantar fasciitis left and equinus left     Plan:  Reviewed left and at this point allowed patient to go over consent form going over alternative treatments complications.  Given the longstanding nature patient is opted for surgery and I do think this is in her best interest and I reviewed surgery and recovery and just because 1 did so well no guarantee on the other and she completely understands this wants surgery signed consent form.  Scheduled for outpatient surgery in the next few weeks and encouraged her to call with questions

## 2019-10-11 ENCOUNTER — Ambulatory Visit: Payer: BC Managed Care – PPO | Admitting: Podiatry

## 2019-10-28 ENCOUNTER — Telehealth: Payer: Self-pay

## 2019-10-28 NOTE — Telephone Encounter (Signed)
DOS 11/02/2019  EPF LT - 18590 GASTROCNEMIUS RECESS LT - 93112  BCBS ST EFFECTIVE DATE - 02/26/2019  PLAN DEDUCTIBLE - $1250.00 w/ $0.00 remaining OUT OF POCKET - $4890.00 W/ $1624.46 REMAINING COPAY $0.00 COINSURANCE - 20% PER SERVICE YEAR  NO PRECERT PER WEBSITE

## 2019-11-02 DIAGNOSIS — M722 Plantar fascial fibromatosis: Secondary | ICD-10-CM

## 2019-11-02 DIAGNOSIS — M216X2 Other acquired deformities of left foot: Secondary | ICD-10-CM | POA: Diagnosis not present

## 2019-11-10 ENCOUNTER — Other Ambulatory Visit: Payer: Self-pay

## 2019-11-10 ENCOUNTER — Encounter: Payer: Self-pay | Admitting: Podiatry

## 2019-11-10 ENCOUNTER — Ambulatory Visit (INDEPENDENT_AMBULATORY_CARE_PROVIDER_SITE_OTHER): Payer: BC Managed Care – PPO | Admitting: Podiatry

## 2019-11-10 VITALS — Temp 97.3°F

## 2019-11-10 DIAGNOSIS — M216X1 Other acquired deformities of right foot: Secondary | ICD-10-CM

## 2019-11-10 DIAGNOSIS — M722 Plantar fascial fibromatosis: Secondary | ICD-10-CM

## 2019-11-10 NOTE — Progress Notes (Signed)
Subjective:   Patient ID: Glenda Rich, female   DOB: 24 y.o.   MRN: [id number]   HPI Patient states doing well with surgery and having mild discomfort but overall pleased   ROS      Objective:  Physical Exam  Neurovascular status intact negative Denna Haggard' sign noted with incision site left medial site healing well within the calf muscle and the plantar incision healing well wound edges well coapted with minimal plantar pain     Assessment:  Doing well post surgery left heel and gastroc     Plan:  Reapplied sterile dressing advised on continued elevation compression and reappoint 2 weeks suture removal earlier if needed and continue boot usage

## 2019-11-29 ENCOUNTER — Other Ambulatory Visit: Payer: Self-pay

## 2019-11-29 ENCOUNTER — Encounter: Payer: Self-pay | Admitting: Podiatry

## 2019-11-29 ENCOUNTER — Ambulatory Visit (INDEPENDENT_AMBULATORY_CARE_PROVIDER_SITE_OTHER): Payer: BC Managed Care – PPO | Admitting: Podiatry

## 2019-11-29 DIAGNOSIS — M722 Plantar fascial fibromatosis: Secondary | ICD-10-CM

## 2019-12-01 NOTE — Progress Notes (Signed)
Subjective:   Patient ID: Glenda Rich, female   DOB: 24 y.o.   MRN: [id number]   HPI Patient presents stating she is doing very well with surgery very happy neuro   ROS      Objective:  Physical Exam  Vascular status intact negative Denna Haggard' sign noted patient's incision sites healing well wound edges well coapted good range of motion no plantar pain     Assessment:  Doing well post endoscopic surgery gastroc recession left     Plan:  Stitches removed wound edges coapted well sterile dressing reapplied ankle compression stocking applied and reappoint as needed with gradual increase in activity at this time

## 2020-03-06 ENCOUNTER — Ambulatory Visit (INDEPENDENT_AMBULATORY_CARE_PROVIDER_SITE_OTHER): Payer: BC Managed Care – PPO | Admitting: Orthotics

## 2020-03-06 ENCOUNTER — Other Ambulatory Visit: Payer: Self-pay

## 2020-03-06 DIAGNOSIS — M722 Plantar fascial fibromatosis: Secondary | ICD-10-CM | POA: Diagnosis not present

## 2020-03-06 DIAGNOSIS — M216X1 Other acquired deformities of right foot: Secondary | ICD-10-CM

## 2020-03-06 NOTE — Progress Notes (Signed)

## 2020-04-03 ENCOUNTER — Other Ambulatory Visit: Payer: Self-pay

## 2020-04-03 ENCOUNTER — Ambulatory Visit: Payer: BC Managed Care – PPO | Admitting: Orthotics

## 2020-04-03 DIAGNOSIS — M722 Plantar fascial fibromatosis: Secondary | ICD-10-CM

## 2020-04-03 DIAGNOSIS — M216X1 Other acquired deformities of right foot: Secondary | ICD-10-CM

## 2020-04-03 NOTE — Progress Notes (Signed)
Patient picked up f/o and was pleased with fit, comfort, and function.  Worked well with footwear.  Told of rbeak in period and how to report any issues.  

## 2020-04-10 ENCOUNTER — Other Ambulatory Visit: Payer: Self-pay

## 2020-04-10 ENCOUNTER — Other Ambulatory Visit (HOSPITAL_COMMUNITY)
Admission: RE | Admit: 2020-04-10 | Discharge: 2020-04-10 | Disposition: A | Discharge status: Home / Self Care | Payer: BC Managed Care – PPO | Source: Ambulatory Visit | Attending: Obstetrics & Gynecology | Admitting: Obstetrics & Gynecology

## 2020-04-10 ENCOUNTER — Encounter: Payer: Self-pay | Admitting: Obstetrics & Gynecology

## 2020-04-10 ENCOUNTER — Ambulatory Visit (INDEPENDENT_AMBULATORY_CARE_PROVIDER_SITE_OTHER): Payer: BC Managed Care – PPO | Admitting: Obstetrics & Gynecology

## 2020-04-10 VITALS — BP 132/80 | HR 94 | Ht 66.0 in | Wt 255.0 lb

## 2020-04-10 DIAGNOSIS — Z01419 Encounter for gynecological examination (general) (routine) without abnormal findings: Secondary | ICD-10-CM

## 2020-04-10 DIAGNOSIS — Z30017 Encounter for initial prescription of implantable subdermal contraceptive: Secondary | ICD-10-CM | POA: Diagnosis not present

## 2020-04-10 DIAGNOSIS — Z3046 Encounter for surveillance of implantable subdermal contraceptive: Secondary | ICD-10-CM | POA: Diagnosis not present

## 2020-04-10 DIAGNOSIS — Z113 Encounter for screening for infections with a predominantly sexual mode of transmission: Secondary | ICD-10-CM | POA: Insufficient documentation

## 2020-04-10 MED ORDER — ETONOGESTREL 68 MG ~~LOC~~ IMPL: 68.0000 mg | DRUG_IMPLANT | Freq: Once | SUBCUTANEOUS | Status: DC

## 2020-04-10 MED ORDER — ETONOGESTREL 68 MG ~~LOC~~ IMPL
68.0000 mg | DRUG_IMPLANT | Freq: Once | SUBCUTANEOUS | Status: AC
  Administered 2020-04-10: 68 mg via SUBCUTANEOUS

## 2020-04-10 NOTE — Progress Notes (Signed)
GYNECOLOGY ANNUAL PREVENTATIVE CARE ENCOUNTER NOTE  History:     Glenda Rich is a 25 y.o. G0P0000 female here for a routine annual gynecologic exam.  Current complaints: here for annual exam, Nexplanon replacement.   Denies abnormal vaginal bleeding, discharge, pelvic pain, problems with intercourse or other gynecologic concerns.    Gynecologic History No LMP recorded. Contraception: Nexplanon Last Pap: 04/15/2017. Results were: normal  Obstetric History OB History  Gravida Para Term Preterm AB Living  0 0 0 0 0 0  SAB IAB Ectopic Multiple Live Births  0 0 0 0 0    Past Medical History:  Diagnosis Date  . Wears glasses     History reviewed. No pertinent surgical history.  Current Outpatient Medications on File Prior to Visit  Medication Sig Dispense Refill  . amphetamine-dextroamphetamine (ADDERALL XR) 10 MG 24 hr capsule Take 10 mg by mouth every morning.    . lamoTRIgine (LAMICTAL) 25 MG tablet Take 25 mg by mouth at bedtime.     No current facility-administered medications on file prior to visit.    No Known Allergies  Social History:  reports that she has never smoked. She has never used smokeless tobacco. She reports that she does not drink alcohol and does not use drugs.  Family History  Problem Relation Age of Onset  . Fibrocystic breast disease Mother   . Dementia Paternal Grandmother     The following portions of the patient's history were reviewed and updated as appropriate: allergies, current medications, past family history, past medical history, past social history, past surgical history and problem list.  Review of Systems Pertinent items noted in HPI and remainder of comprehensive ROS otherwise negative.  Physical Exam:  BP 132/80   Pulse 94   Ht 5\' 6"  (1.676 m)   Wt 255 lb (115.7 kg)   BMI 41.16 kg/m  CONSTITUTIONAL: Well-developed, well-nourished female in no acute distress.  HENT:  Normocephalic, atraumatic, External right and left ear  normal.  EYES: Conjunctivae and EOM are normal. Pupils are equal, round, and reactive to light. No scleral icterus.  NECK: Normal range of motion, supple, no masses.  Normal thyroid.  SKIN: Skin is warm and dry. No rash noted. Not diaphoretic. No erythema. No pallor. MUSCULOSKELETAL: Normal range of motion. No tenderness.  No cyanosis, clubbing, or edema. NEUROLOGIC: Alert and oriented to person, place, and time. Normal reflexes, muscle tone coordination.  PSYCHIATRIC: Normal mood and affect. Normal behavior. Normal judgment and thought content. CARDIOVASCULAR: Normal heart rate noted, regular rhythm RESPIRATORY: Clear to auscultation bilaterally. Effort and breath sounds normal, no problems with respiration noted. BREASTS: Symmetric in size. No masses, tenderness, skin changes, nipple drainage, or lymphadenopathy bilaterally. Performed in the presence of a chaperone. ABDOMEN: Soft, obese, no distention noted.  No tenderness, rebound or guarding.  PELVIC: Normal appearing external genitalia and urethral meatus; normal appearing vaginal mucosa and cervix.  No abnormal discharge noted.  Pap smear obtained.  Normal uterine size, no other palpable masses, no uterine or adnexal tenderness.  Performed in the presence of a chaperone.  Nexplanon Removal and Reinsertion Patient identified, informed consent performed, consent signed.   Patient does understand that irregular bleeding is a very common side effect of this medication. She was advised to have backup contraception for one week after replacement of the implant. Pregnancy test in clinic today was negative.  Appropriate time out taken. Nexplanon site identified in left arm.  Area prepped in usual sterile fashon. 3 ml  of 1% lidocaine was used to anesthetize the area at the distal end of the implant. A small stab incision was made right beside the implant on the distal portion. The Nexplanon rod was grasped using hemostats and removed without difficulty.  There was minimal blood loss. There were no complications. Area was then injected with 3 ml of 1 % lidocaine. She was re-prepped with betadine, Nexplanon removed from packaging, Device confirmed in needle, then inserted full length of needle and withdrawn per handbook instructions. Nexplanon was able to palpated in the patient's arm; patient palpated the insert herself.  There was minimal blood loss. Patient insertion site covered with gauze and a pressure bandage to reduce any bruising. The patient tolerated the procedure well and was given post procedure instructions.  She was advised to have backup contraception for one week.      Assessment and Plan:      1. Encounter for removal and reinsertion of Nexplanon - etonogestrel (NEXPLANON) implant 68 mg replaced today. No complications  2. Well woman exam with routine gynecological exam - Cytology - PAP( [facility]) Will follow up results of pap smear and manage accordingly. Routine preventative health maintenance measures emphasized. Please refer to After Visit Summary for other counseling recommendations.      Jaynie Collins, MD, FACOG Obstetrician & Gynecologist, [facility] for [employer], [facility]

## 2020-04-10 NOTE — Patient Instructions (Signed)
Nexplanon Instructions After Insertion   Keep bandage clean and dry for 24 hours   May use ice/Tylenol/Ibuprofen for soreness or pain   If you develop fever, drainage or increased warmth from incision site-contact office immediately   Preventive Care 31-26 Years Old, Female Preventive care refers to lifestyle choices and visits with your health care provider that can promote health and wellness. This includes:  A yearly physical exam. This is also called an annual wellness visit.  Regular dental and eye exams.  Immunizations.  Screening for certain conditions.  Healthy lifestyle choices, such as: ? Eating a healthy diet. ? Getting regular exercise. ? Not using drugs or products that contain nicotine and tobacco. ? Limiting alcohol use. What can I expect for my preventive care visit? Physical exam Your health care provider may check your:  Height and weight. These may be used to calculate your BMI (body mass index). BMI is a measurement that tells if you are at a healthy weight.  Heart rate and blood pressure.  Body temperature.  Skin for abnormal spots. Counseling Your health care provider may ask you questions about your:  Past medical problems.  Family's medical history.  Alcohol, tobacco, and drug use.  Emotional well-being.  Home life and relationship well-being.  Sexual activity.  Diet, exercise, and sleep habits.  Work and work Statistician.  Access to firearms.  Method of birth control.  Menstrual cycle.  Pregnancy history. What immunizations do I need? Vaccines are usually given at various ages, according to a schedule. Your health care provider will recommend vaccines for you based on your age, medical history, and lifestyle or other factors, such as travel or where you work.   What tests do I need? Blood tests  Lipid and cholesterol levels. These may be checked every 5 years starting at age 3.  Hepatitis C test.  Hepatitis B  test. Screening  Diabetes screening. This is done by checking your blood sugar (glucose) after you have not eaten for a while (fasting).  STD (sexually transmitted disease) testing, if you are at risk.  BRCA-related cancer screening. This may be done if you have a family history of breast, ovarian, tubal, or peritoneal cancers.  Pelvic exam and Pap test. This may be done every 3 years starting at age 26 72. Starting at age 26, this may be done every 5 years if you have a Pap test in combination with an HPV test. Talk with your health care provider about your test results, treatment options, and if necessary, the need for more tests.   Follow these instructions at home: Eating and drinking  Eat a healthy diet that includes fresh fruits and vegetables, whole grains, lean protein, and low-fat dairy products.  Take vitamin and mineral supplements as recommended by your health care provider.  Do not drink alcohol if: ? Your health care provider tells you not to drink. ? You are pregnant, may be pregnant, or are planning to become pregnant.  If you drink alcohol: ? Limit how much you have to 0-1 drink a day. ? Be aware of how much alcohol is in your drink. In the [place] one drink equals one 12 oz bottle of beer (355 mL), one 5 oz glass of wine (148 mL), or one 1 oz glass of hard liquor (44 mL).   Lifestyle  Take daily care of your teeth and gums. Brush your teeth every morning and night with fluoride toothpaste. Floss one time each day.  Stay active. Exercise for at  least 30 minutes 5 or more days each week.  Do not use any products that contain nicotine or tobacco, such as cigarettes, e-cigarettes, and chewing tobacco. If you need help quitting, ask your health care provider.  Do not use drugs.  If you are sexually active, practice safe sex. Use a condom or other form of protection to prevent STIs (sexually transmitted infections).  If you do not wish to become pregnant, use a form of  birth control. If you plan to become pregnant, see your health care provider for a prepregnancy visit.  Find healthy ways to cope with stress, such as: ? Meditation, yoga, or listening to music. ? Journaling. ? Talking to a trusted person. ? Spending time with friends and family. Safety  Always wear your seat belt while driving or riding in a vehicle.  Do not drive: ? If you have been drinking alcohol. Do not ride with someone who has been drinking. ? When you are tired or distracted. ? While texting.  Wear a helmet and other protective equipment during sports activities.  If you have firearms in your house, make sure you follow all gun safety procedures.  Seek help if you have been physically or sexually abused. What's next?  Go to your health care provider once a year for an annual wellness visit.  Ask your health care provider how often you should have your eyes and teeth checked.  Stay up to date on all vaccines. This information is not intended to replace advice given to you by your health care provider. Make sure you discuss any questions you have with your health care provider. Document Revised: 10/10/2019 Document Reviewed: 10/23/2017 Elsevier Patient Education  2021 Elsevier Inc.  

## 2020-04-12 LAB — CYTOLOGY - PAP
Chlamydia: NEGATIVE
Comment: NEGATIVE
Comment: NEGATIVE
Comment: NORMAL
Diagnosis: NEGATIVE
Neisseria Gonorrhea: NEGATIVE
Trichomonas: NEGATIVE

## 2021-08-06 DIAGNOSIS — J342 Deviated nasal septum: Secondary | ICD-10-CM | POA: Insufficient documentation

## 2021-08-06 DIAGNOSIS — J310 Chronic rhinitis: Secondary | ICD-10-CM | POA: Insufficient documentation

## 2022-05-02 ENCOUNTER — Ambulatory Visit: Payer: Medicaid Other | Admitting: Family Medicine

## 2022-05-03 ENCOUNTER — Ambulatory Visit: Payer: BC Managed Care – PPO | Admitting: Nurse Practitioner

## 2022-05-03 ENCOUNTER — Encounter: Payer: Self-pay | Admitting: Nurse Practitioner

## 2022-05-03 VITALS — BP 128/78 | HR 79 | Temp 98.3°F | Resp 16 | Ht 66.0 in | Wt 237.5 lb

## 2022-05-03 DIAGNOSIS — Z8269 Family history of other diseases of the musculoskeletal system and connective tissue: Secondary | ICD-10-CM

## 2022-05-03 DIAGNOSIS — Z8349 Family history of other endocrine, nutritional and metabolic diseases: Secondary | ICD-10-CM

## 2022-05-03 DIAGNOSIS — Z833 Family history of diabetes mellitus: Secondary | ICD-10-CM | POA: Diagnosis not present

## 2022-05-03 DIAGNOSIS — E669 Obesity, unspecified: Secondary | ICD-10-CM | POA: Diagnosis not present

## 2022-05-03 DIAGNOSIS — M2550 Pain in unspecified joint: Secondary | ICD-10-CM | POA: Diagnosis not present

## 2022-05-03 LAB — LIPID PANEL
Cholesterol: 131 mg/dL (ref 0–200)
HDL: 44.1 mg/dL (ref >39.00)
LDL Cholesterol: 66 mg/dL (ref 0–99)
NonHDL: 87.12
Total CHOL/HDL Ratio: 3
Triglycerides: 104 mg/dL (ref 0.0–149.0)
VLDL: 20.8 mg/dL (ref 0.0–40.0)

## 2022-05-03 LAB — COMPREHENSIVE METABOLIC PANEL
ALT: 19 U/L (ref 0–35)
AST: 16 U/L (ref 0–37)
Albumin: 4.1 g/dL (ref 3.5–5.2)
Alkaline Phosphatase: 88 U/L (ref 39–117)
BUN: 8 mg/dL (ref 6–23)
CO2: 29 mEq/L (ref 19–32)
Calcium: 9.4 mg/dL (ref 8.4–10.5)
Chloride: 102 mEq/L (ref 96–112)
Creatinine, Ser: 0.71 mg/dL (ref 0.40–1.20)
GFR: 116.2 mL/min (ref >60.00)
Glucose, Bld: 84 mg/dL (ref 70–99)
Potassium: 4.2 mEq/L (ref 3.5–5.1)
Sodium: 138 mEq/L (ref 135–145)
Total Bilirubin: 0.7 mg/dL (ref 0.2–1.2)
Total Protein: 6.7 g/dL (ref 6.0–8.3)

## 2022-05-03 LAB — CBC
HCT: 41.7 % (ref 36.0–46.0)
Hemoglobin: 14.1 g/dL (ref 12.0–15.0)
MCHC: 33.9 g/dL (ref 30.0–36.0)
MCV: 90.1 fl (ref 78.0–100.0)
Platelets: 343 10*3/uL (ref 150.0–400.0)
RBC: 4.63 Mil/uL (ref 3.87–5.11)
RDW: 12.4 % (ref 11.5–15.5)
WBC: 9.6 10*3/uL (ref 4.0–10.5)

## 2022-05-03 LAB — HEMOGLOBIN A1C: Hgb A1c MFr Bld: 5.4 % (ref 4.6–6.5)

## 2022-05-03 LAB — TSH: TSH: 4.92 u[IU]/mL (ref 0.35–5.50)

## 2022-05-03 NOTE — Patient Instructions (Signed)
Nice to see you today I will be in touch with the labs once I have them Follow up with me as scheduled, sooner if you need me

## 2022-05-03 NOTE — Assessment & Plan Note (Signed)
Patient was discussing this with family and friend states she to be checked for lupus.  With multiple joint pains.  Pending ANA today

## 2022-05-03 NOTE — Assessment & Plan Note (Signed)
Check ANA pending result

## 2022-05-03 NOTE — Assessment & Plan Note (Signed)
Check TSH level pending result

## 2022-05-03 NOTE — Assessment & Plan Note (Signed)
Check A1c pending result

## 2022-05-03 NOTE — Progress Notes (Signed)
Acute Office Visit  Subjective:     Patient ID: Glenda Rich, female    DOB: 09-05-1995, 27 y.o.   MRN: [id number]  Chief Complaint  Patient presents with   Medical Management of Chronic Issues    Want to get checked for lupus, diabetes, and have thyroid checked. She stated that these issues run in the family.    HPI Patient is in today for multiple complaints  Great aunts on her fathers side had diabetes Two maternal greatgrand mohter had diabetes Paternal grandmom thyroid   States that she had bilateral tendon stretching. States that she has pain in both feet. Hip pian also. She denies rashes or skin eruptions  States that there is a family history of thyroid disease. States that she does have some hair loss.   States that she does have a dry mouth and wants to drink lots of water. States that she does want to. States that the urinatoin is hard to say as she does drink lots of sugar drinks  Review of Systems  Constitutional:  Negative for chills and fever.  Respiratory:  Positive for shortness of breath (exertoin).   Cardiovascular:  Negative for chest pain.  Neurological:  Positive for headaches.  Psychiatric/Behavioral:  Negative for hallucinations and suicidal ideas.         Objective:    BP 128/78   Pulse 79   Temp 98.3 F (36.8 C)   Resp 16   Ht '5\' 6"'$  (1.676 m)   Wt 237 lb 8 oz (107.7 kg)   SpO2 99%   BMI 38.33 kg/m  BP Readings from Last 3 Encounters:  05/03/22 128/78  04/10/20 132/80  09/06/19 120/82   Wt Readings from Last 3 Encounters:  05/03/22 237 lb 8 oz (107.7 kg)  04/10/20 255 lb (115.7 kg)  09/06/19 272 lb (123.4 kg)      Physical Exam Vitals and nursing note reviewed.  Constitutional:      Appearance: Normal appearance. She is obese.  Cardiovascular:     Rate and Rhythm: Normal rate and regular rhythm.     Heart sounds: Normal heart sounds.  Pulmonary:     Effort: Pulmonary effort is normal.     Breath sounds: Normal breath  sounds.  Lymphadenopathy:     Cervical: No cervical adenopathy.  Neurological:     Mental Status: She is alert.     No results found for any visits on 05/03/22.      Assessment & Plan:   Problem List Items Addressed This Visit       Other   Obesity (BMI 30-39.9) - Primary    Reviewed patient's body habitus will check basic labs inclusive of TSH, A1c, lipid panel      Relevant Orders   CBC   Comprehensive metabolic panel   Hemoglobin A1c   TSH   Lipid panel   Pain in joint, multiple sites    Patient was discussing this with family and friend states she to be checked for lupus.  With multiple joint pains.  Pending ANA today      Relevant Orders   ANA   Family history of systemic lupus erythematosus    Check ANA pending result      Relevant Orders   ANA   Family history of diabetes mellitus    Check A1c pending result      Relevant Orders   Hemoglobin A1c   Family history of thyroid disease    Check TSH  level pending result      Relevant Orders   TSH    No orders of the defined types were placed in this encounter.   Return if symptoms worsen or fail to improve, for As scheduled .  Romilda Garret, NP

## 2022-05-03 NOTE — Assessment & Plan Note (Signed)
Reviewed patient's body habitus will check basic labs inclusive of TSH, A1c, lipid panel

## 2022-05-06 LAB — ANTI-NUCLEAR AB-TITER (ANA TITER): ANA Titer 1: 1:40 {titer} — ABNORMAL HIGH

## 2022-05-06 LAB — ANA: Anti Nuclear Antibody (ANA): POSITIVE — AB

## 2022-05-09 ENCOUNTER — Encounter: Payer: Self-pay | Admitting: Nurse Practitioner

## 2022-05-09 DIAGNOSIS — M2550 Pain in unspecified joint: Secondary | ICD-10-CM

## 2022-05-09 DIAGNOSIS — R768 Other specified abnormal immunological findings in serum: Secondary | ICD-10-CM

## 2022-05-09 DIAGNOSIS — Z8269 Family history of other diseases of the musculoskeletal system and connective tissue: Secondary | ICD-10-CM

## 2022-05-10 DIAGNOSIS — R768 Other specified abnormal immunological findings in serum: Secondary | ICD-10-CM | POA: Insufficient documentation

## 2022-05-10 DIAGNOSIS — H5213 Myopia, bilateral: Secondary | ICD-10-CM | POA: Diagnosis not present

## 2022-06-26 ENCOUNTER — Ambulatory Visit: Payer: BC Managed Care – PPO | Admitting: Nurse Practitioner

## 2022-06-26 ENCOUNTER — Encounter: Payer: Self-pay | Admitting: Nurse Practitioner

## 2022-06-26 VITALS — BP 122/68 | HR 92 | Temp 98.1°F | Resp 16 | Ht 66.0 in | Wt 235.4 lb

## 2022-06-26 DIAGNOSIS — Z23 Encounter for immunization: Secondary | ICD-10-CM

## 2022-06-26 DIAGNOSIS — Z0000 Encounter for general adult medical examination without abnormal findings: Secondary | ICD-10-CM | POA: Diagnosis not present

## 2022-06-26 DIAGNOSIS — E669 Obesity, unspecified: Secondary | ICD-10-CM

## 2022-06-26 NOTE — Progress Notes (Signed)
Established Patient Office Visit  Subjective   Patient ID: Glenda Rich, female    DOB: June 13, 1995  Age: 27 y.o. MRN: [id number]  Chief Complaint  Patient presents with   Establish Care    Transferring from [facility] Exam    HPI  Mood disorder: patient is currenlty maintained on lamictal is followed by Freida Busman.  Call to see him monthly.  Patient denies HI/SI/AVH  ADD/ADHD: patient is followed by Glenetta Hew out of [place] and is on Adderall 20 mg QD and adderall 10mg  QD.   Positive ANA: was referred to [facility] rheumatology. She was contacted and message left for patient to schedule. Scheduled for 09/18/2022 to see Dr. Sheliah Hatch  for complete physical and follow up of chronic conditions.  Immunizations: -Tetanus: Up date today -Influenza: out of season  -Shingles: Too young -Pneumonia too young -HPV: thinks she got them growing up   Diet: Fair diet. States that she is eating 2 meals a day. States that she does not eat breakfast will eat lunch and works scond shft. States that she snacks. States that she does drink a good bit of soda. And working on water Exercise: No regular exercise. Does stretching.   Eye exam: Completes annually. Wears glasses. Dental exam: Completes semi-annually    Colonoscopy: Too young, currently average risk Lung Cancer Screening: N/A  Pap smear: 2022 and has nexplaon and is due 2025  DEXA: Too young  Mammogram: too young states that her great grandmother on both sides had breast CA.  Breast exam at gyn and she will do intermittent breast exams at home  Sleep: states that she will go to bed 2am and get up at approx 10am. States that she does feel rested sometimes. States that she does snore.        Review of Systems  Constitutional:  Negative for chills and fever.  Respiratory:  Negative for shortness of breath.   Cardiovascular:  Negative for chest pain and leg swelling.  Gastrointestinal:  Negative for abdominal  pain, blood in stool, constipation, diarrhea, nausea and vomiting.       BM at least several times a week   Genitourinary:  Negative for dysuria and hematuria.       Irregular period with nexplanon   Neurological:  Negative for tingling and headaches.  Psychiatric/Behavioral:  Negative for hallucinations and suicidal ideas.       Objective:     BP 122/68   Pulse 92   Temp 98.1 F (36.7 C)   Resp 16   Ht 5\' 6"  (1.676 m)   Wt 235 lb 6 oz (106.8 kg)   SpO2 99%   BMI 37.99 kg/m  BP Readings from Last 3 Encounters:  06/26/22 122/68  05/03/22 128/78  04/10/20 132/80   Wt Readings from Last 3 Encounters:  06/26/22 235 lb 6 oz (106.8 kg)  05/03/22 237 lb 8 oz (107.7 kg)  04/10/20 255 lb (115.7 kg)      Physical Exam Vitals and nursing note reviewed.  Constitutional:      Appearance: Normal appearance.  HENT:     Right Ear: Tympanic membrane, ear canal and external ear normal.     Left Ear: Tympanic membrane, ear canal and external ear normal.     Mouth/Throat:     Mouth: Mucous membranes are moist.     Pharynx: Oropharynx is clear.  Eyes:     Extraocular Movements: Extraocular movements intact.     Pupils: Pupils  are equal, round, and reactive to light.  Cardiovascular:     Rate and Rhythm: Normal rate and regular rhythm.     Pulses: Normal pulses.     Heart sounds: Normal heart sounds.  Pulmonary:     Effort: Pulmonary effort is normal.     Breath sounds: Normal breath sounds.  Abdominal:     General: Bowel sounds are normal. There is no distension.     Palpations: There is no mass.     Tenderness: There is no abdominal tenderness.     Hernia: No hernia is present.  Musculoskeletal:     Right lower leg: No edema.     Left lower leg: No edema.  Lymphadenopathy:     Cervical: No cervical adenopathy.  Skin:    General: Skin is warm.  Neurological:     General: No focal deficit present.     Mental Status: She is alert.     Deep Tendon Reflexes:     Reflex  Scores:      Bicep reflexes are 2+ on the right side and 2+ on the left side.      Patellar reflexes are 2+ on the right side and 2+ on the left side.    Comments: Bilateral upper and lower extremity strength 5/5  Psychiatric:        Mood and Affect: Mood normal.        Behavior: Behavior normal.        Thought Content: Thought content normal.        Judgment: Judgment normal.      No results found for any visits on 06/26/22.    The ASCVD Risk score (Arnett DK, et al., 2019) failed to calculate for the following reasons:   The 2019 ASCVD risk score is only valid for ages 65 to 13    Assessment & Plan:   Problem List Items Addressed This Visit       Other   Obesity (BMI 30-39.9)    Encouraged healthy lifestyle modifications inclusive of diet and exercise.  Didgive recommendations of the and goal of exercising 30 minutes a day 5 times a week.      Preventative health care - Primary    Discussed age-appropriate immunizations and screening exams.  We did update patient's tetanus vaccine today all other vaccines are up-to-date.  Did review patient's personal, surgical, social, family histories.  Patient is too young for CRC screening or breast cancer screening.  Patient is up-to-date on her cervical cancer screening.  Is followed by GYN and has Nexplanon for birth control.  Patient was given information at discharge about preventative healthcare maintenance with anticipatory guidance.      Other Visit Diagnoses     Need for Tdap vaccination       Relevant Orders   Tdap vaccine greater than or equal to 7yo IM (Completed)       Return in about 1 year (around 06/26/2023) for CPE and Labs.    Audria Nine, NP

## 2022-06-26 NOTE — Assessment & Plan Note (Signed)
Discussed age-appropriate immunizations and screening exams.  We did update patient's tetanus vaccine today all other vaccines are up-to-date.  Did review patient's personal, surgical, social, family histories.  Patient is too young for CRC screening or breast cancer screening.  Patient is up-to-date on her cervical cancer screening.  Is followed by GYN and has Nexplanon for birth control.  Patient was given information at discharge about preventative healthcare maintenance with anticipatory guidance.

## 2022-06-26 NOTE — Assessment & Plan Note (Addendum)
Encouraged healthy lifestyle modifications inclusive of diet and exercise.  Didgive recommendations of the and goal of exercising 30 minutes a day 5 times a week.

## 2022-06-26 NOTE — Patient Instructions (Signed)
Nice to see you today We did update your tetanus vaccine today I want to see you in 1 year for your next physical and labs, sooner if you need me  Work on increasing your exercise with the goal being 30 mins a day 5 days a week

## 2022-09-18 ENCOUNTER — Encounter: Payer: BC Managed Care – PPO | Admitting: Internal Medicine

## 2022-09-18 NOTE — Progress Notes (Deleted)
Office Visit Note  Patient: Glenda Rich             Date of Birth: 06-Dec-1995           MRN: [id number]             PCP: Eden Emms, NP Referring: Eden Emms, NP Visit Date: 09/18/2022 Occupation: @GUAROCC @  Subjective:  No chief complaint on file.   History of Present Illness: Glenda Rich is a 27 y.o. female ***     Activities of Daily Living:  Patient reports morning stiffness for *** {minute/hour:19697}.   Patient {ACTIONS;DENIES/REPORTS:21021675::"Denies"} nocturnal pain.  Difficulty dressing/grooming: {ACTIONS;DENIES/REPORTS:21021675::"Denies"} Difficulty climbing stairs: {ACTIONS;DENIES/REPORTS:21021675::"Denies"} Difficulty getting out of chair: {ACTIONS;DENIES/REPORTS:21021675::"Denies"} Difficulty using hands for taps, buttons, cutlery, and/or writing: {ACTIONS;DENIES/REPORTS:21021675::"Denies"}  No Rheumatology ROS completed.   PMFS History:  Patient Active Problem List   Diagnosis Date Noted   Preventative health care 06/26/2022   Positive ANA (antinuclear antibody) 05/10/2022   Obesity (BMI 30-39.9) 05/03/2022   Pain in joint, multiple sites 05/03/2022   Family history of systemic lupus erythematosus 05/03/2022   Family history of diabetes mellitus 05/03/2022   Family history of thyroid disease 05/03/2022   Chronic rhinitis 08/06/2021   Deviated nasal septum 08/06/2021    Past Medical History:  Diagnosis Date   Plantar fasciitis, bilateral    Wears glasses     Family History  Problem Relation Age of Onset   Fibrocystic breast disease Mother    Thyroid disease Mother    Fibromyalgia Mother    Lupus Maternal Aunt    Lupus Maternal Grandmother    Dementia Paternal Grandmother    Heart attack Paternal Grandfather        x2   Past Surgical History:  Procedure Laterality Date   LEG SURGERY     2020   Social History   Social History Narrative   [name] that she has 2 jobs.      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   Physical Exam   Musculoskeletal Exam: ***  CDAI Exam: CDAI Score: -- Patient Global: --; Provider Global: -- Swollen: --; Tender: -- Joint Exam 09/18/2022   No joint exam has been documented for this visit   There is currently no information documented on the homunculus. Go to the Rheumatology activity and complete the homunculus joint exam.  Investigation: No additional findings.  Imaging: No results found.  Recent Labs: Lab Results  Component Value Date   WBC 9.6 05/03/2022   HGB 14.1 05/03/2022   PLT 343.0 05/03/2022   NA 138 05/03/2022   K 4.2 05/03/2022   CL 102 05/03/2022   CO2 29 05/03/2022   GLUCOSE 84 05/03/2022   BUN 8 05/03/2022   CREATININE 0.71 05/03/2022   BILITOT 0.7 05/03/2022   ALKPHOS 88 05/03/2022   AST 16 05/03/2022   ALT 19 05/03/2022   PROT 6.7 05/03/2022   ALBUMIN 4.1 05/03/2022   CALCIUM 9.4 05/03/2022    Speciality Comments: No specialty comments available.  Procedures:  No procedures performed Allergies: Patient has no known allergies.   Assessment / Plan:     Visit Diagnoses: No diagnosis found.  Orders: No orders of the defined  types were placed in this encounter.  No orders of the defined types were placed in this encounter.   Face-to-face time spent with patient was *** minutes. Greater than 50% of time was spent in counseling and coordination of care.  Follow-Up Instructions: No follow-ups on file.   Fuller Plan, MD  Note - This record has been created using [employer].  Chart creation errors have been sought, but may not always  have been located. Such creation errors do not reflect on  the standard of medical care.

## 2022-11-18 ENCOUNTER — Encounter (HOSPITAL_BASED_OUTPATIENT_CLINIC_OR_DEPARTMENT_OTHER): Payer: Self-pay | Admitting: Otolaryngology

## 2022-11-18 ENCOUNTER — Other Ambulatory Visit: Payer: Self-pay

## 2022-11-21 NOTE — H&P (Signed)
HPI:   Chief Complaint  Patient presents with  Recurrent Sinusitis  Patient states she thinks she has chronic sinus issues x years   Glenda Rich is a 27 y.o. female who presents as an established patient for nasal obstruction. Patient initially seen on 08/06/21 for nasal congestion. She stated this problem had persisted for as long as she could remember. Problem is year round. Congestion is bilateral but right side is worse. She does snore routinely; no concern for sleep apnea. She is not aware of having recurrent sinus infection. No recent sinus or head imaging. No history of sinonasal surgery or nasal trauma. Nasal exam demonstrated mild rightward septal deviation with mild bony spurring on the left side. No signs of infection. Patient started on Flonase. She returns today and states she used this consistently for about 7 months without significant change. She denies use of topical decongestant sprays. Has lived in [place] all her life. No formal allergy testing.   Denies otalgia, otorrhea, ear pruritus, tinnitus, hearing loss, dizziness, purulent rhinorrhea, facial pressure/pain, dental pain/sensitivity, acute vision changes or dysphagia.  No PMH of asthma, diabetes, bleeding disorder or adverse reaction to anesthesia.   Non-smoker.  PMH/Meds/All/SocHx/FamHx/ROS:   Past Medical History:  Diagnosis Date  Anxiety  Depression   Past Surgical History:  Procedure Laterality Date  FOOT SURGERY Bilateral  Procedure: FOOT SURGERY  LEG SURGERY Bilateral  Procedure: LEG SURGERY   No family history of bleeding disorders, wound healing problems or difficulty with anesthesia.     Current Outpatient Medications:  dextroamphetamine-amphetamine (ADDERALL XR) 10 mg 24 hr capsule, Take 10 mg by mouth every morning., Disp: , Rfl:  lamoTRIgine (LaMICtal) 200 mg tablet, Take 200 mg by mouth 2 (two) times a day., Disp: , Rfl:  fluticasone propionate (FLONASE) 50 mcg/spray nasal spray, Instill 2 puffs  each nostril every night. (Patient not taking: Reported on 08/08/2022), Disp: 16 g, Rfl: 5  A complete ROS was performed with pertinent positives/negatives noted in the HPI. The remainder of the ROS are negative.   Physical Exam:   Temp 97.3 F (36.3 C) (Temporal)  Resp 16  Ht 1.676 m (5\' 6" )  Wt 107 kg (235 lb)  BMI 37.93 kg/m   General Awake, at baseline alertness during examination.  Eyes No scleral icterus or conjunctival hemorrhage. Globe position appears normal. EOMI.  Right Ear EAC patent, TM intact w/o inflammation. Middle ear well aerated.  Left Ear EAC patent, TM intact w/o inflammation. Middle ear well aerated.  Nose Bony spurring on the left anterior nasal septum. Patent, no polyps or masses seen on anterior rhinoscopy.  Oral cavity No mucosal lesions or tumors seen. Tongue midline.  Oropharynx Symmetric tonsils.  Neck No abnormal cervical lymphadenopathy. No thyromegaly. No thyroid masses palpated.  Cardio-vascular No cyanosis.  Pulmonary No audible stridor. Breathing easily with no labor.  Neuro Symmetric facial movement.  Psychiatry Appropriate affect and mood for clinic visit.   Independent Review of Additional Tests or Records:  Medical records.   Procedures:   Nasal Endoscopy  Technique: The procedure was discussed including risks and complications. Questions were answered. Informed consent was obtained.   After anesthetizing the nasal cavity with topical lidocaine and oxymetazoline, the flexible endoscope was introduced and passed through both nasal cavities into the nasopharynx. Bony spurring of the nasal septum noted on the left side. No nasal polyps or mucopurulence. Mild cobblestoning of nasopharyngeal mucosa.   She tolerated the procedure well.   Impression & Plans:  Glenda Rich is  a 27 y.o. female with persistent nasal obstruction despite 37-month history of consistent use of a topical nasal steroid spray. Nasal endoscopy demonstrates bony spurring of  the nasal septum on the left side without signs of polyps or infection. There is mild cobblestoning of nasopharyngeal mucosa, suggesting an allergy component. Recommend further evaluation with maxillofacial CT imaging. We will contact her with those results and recommendations going forward. Follow up accordingly with ENT.   Patient agrees with the plan.

## 2022-11-25 ENCOUNTER — Ambulatory Visit (HOSPITAL_BASED_OUTPATIENT_CLINIC_OR_DEPARTMENT_OTHER): Payer: BC Managed Care – PPO | Admitting: Anesthesiology

## 2022-11-25 ENCOUNTER — Ambulatory Visit (HOSPITAL_BASED_OUTPATIENT_CLINIC_OR_DEPARTMENT_OTHER)
Admission: RE | Admit: 2022-11-25 | Discharge: 2022-11-25 | Disposition: A | Discharge status: Home / Self Care | Payer: BC Managed Care – PPO | Attending: Otolaryngology | Admitting: Otolaryngology

## 2022-11-25 ENCOUNTER — Encounter (HOSPITAL_BASED_OUTPATIENT_CLINIC_OR_DEPARTMENT_OTHER)
Admission: RE | Disposition: A | Discharge status: Home / Self Care | Payer: Self-pay | Source: Home / Self Care | Attending: Otolaryngology

## 2022-11-25 ENCOUNTER — Encounter (HOSPITAL_BASED_OUTPATIENT_CLINIC_OR_DEPARTMENT_OTHER): Payer: Self-pay | Admitting: Otolaryngology

## 2022-11-25 ENCOUNTER — Ambulatory Visit (HOSPITAL_BASED_OUTPATIENT_CLINIC_OR_DEPARTMENT_OTHER): Payer: Self-pay | Admitting: Anesthesiology

## 2022-11-25 DIAGNOSIS — J343 Hypertrophy of nasal turbinates: Secondary | ICD-10-CM | POA: Insufficient documentation

## 2022-11-25 DIAGNOSIS — J342 Deviated nasal septum: Secondary | ICD-10-CM | POA: Insufficient documentation

## 2022-11-25 DIAGNOSIS — Z01818 Encounter for other preprocedural examination: Secondary | ICD-10-CM

## 2022-11-25 DIAGNOSIS — J310 Chronic rhinitis: Secondary | ICD-10-CM | POA: Insufficient documentation

## 2022-11-25 HISTORY — DX: Bipolar disorder, unspecified: F31.9

## 2022-11-25 HISTORY — PX: NASAL SEPTOPLASTY W/ TURBINOPLASTY: SHX2070

## 2022-11-25 LAB — POCT PREGNANCY, URINE: Preg Test, Ur: NEGATIVE

## 2022-11-25 SURGERY — SEPTOPLASTY, NOSE, WITH NASAL TURBINATE REDUCTION
Anesthesia: General | Site: Nose | Laterality: Bilateral

## 2022-11-25 MED ORDER — SUGAMMADEX SODIUM 200 MG/2ML IV SOLN
INTRAVENOUS | Status: DC | PRN
  Administered 2022-11-25: 200 mg via INTRAVENOUS

## 2022-11-25 MED ORDER — ACETAMINOPHEN 500 MG PO TABS
ORAL_TABLET | ORAL | Status: AC
  Filled 2022-11-25: qty 2

## 2022-11-25 MED ORDER — DROPERIDOL 2.5 MG/ML IJ SOLN: 0.6250 mg | Freq: Once | INTRAMUSCULAR | Status: DC | PRN

## 2022-11-25 MED ORDER — MIDAZOLAM HCL 2 MG/2ML IJ SOLN
INTRAMUSCULAR | Status: AC
  Filled 2022-11-25: qty 2

## 2022-11-25 MED ORDER — BACITRACIN ZINC 500 UNIT/GM EX OINT
TOPICAL_OINTMENT | CUTANEOUS | Status: AC
  Filled 2022-11-25: qty 28.35

## 2022-11-25 MED ORDER — ONDANSETRON 4 MG PO TBDP: 4.0000 mg | ORAL_TABLET | Freq: Three times a day (TID) | ORAL | 0 refills | Status: AC | PRN

## 2022-11-25 MED ORDER — PROPOFOL 10 MG/ML IV BOLUS
INTRAVENOUS | Status: AC
  Filled 2022-11-25: qty 20

## 2022-11-25 MED ORDER — FENTANYL CITRATE (PF) 100 MCG/2ML IJ SOLN: 25.0000 ug | INTRAMUSCULAR | Status: DC | PRN

## 2022-11-25 MED ORDER — LIDOCAINE 2% (20 MG/ML) 5 ML SYRINGE
INTRAMUSCULAR | Status: AC
  Filled 2022-11-25: qty 5

## 2022-11-25 MED ORDER — LACTATED RINGERS IV SOLN: INTRAVENOUS | Status: DC

## 2022-11-25 MED ORDER — DEXAMETHASONE SODIUM PHOSPHATE 10 MG/ML IJ SOLN
INTRAMUSCULAR | Status: AC
  Filled 2022-11-25: qty 1

## 2022-11-25 MED ORDER — FENTANYL CITRATE (PF) 100 MCG/2ML IJ SOLN
INTRAMUSCULAR | Status: AC
  Filled 2022-11-25: qty 2

## 2022-11-25 MED ORDER — LIDOCAINE 2% (20 MG/ML) 5 ML SYRINGE
INTRAMUSCULAR | Status: DC | PRN
  Administered 2022-11-25: 100 mg via INTRAVENOUS

## 2022-11-25 MED ORDER — ROCURONIUM BROMIDE 100 MG/10ML IV SOLN
INTRAVENOUS | Status: DC | PRN
  Administered 2022-11-25: 70 mg via INTRAVENOUS

## 2022-11-25 MED ORDER — BACITRACIN ZINC 500 UNIT/GM EX OINT
TOPICAL_OINTMENT | CUTANEOUS | Status: DC | PRN
  Administered 2022-11-25: 1 via TOPICAL

## 2022-11-25 MED ORDER — OXYMETAZOLINE HCL 0.05 % NA SOLN
NASAL | Status: AC
  Filled 2022-11-25: qty 30

## 2022-11-25 MED ORDER — ONDANSETRON HCL 4 MG/2ML IJ SOLN
INTRAMUSCULAR | Status: AC
  Filled 2022-11-25: qty 2

## 2022-11-25 MED ORDER — MIDAZOLAM HCL 5 MG/5ML IJ SOLN
INTRAMUSCULAR | Status: DC | PRN
  Administered 2022-11-25: 2 mg via INTRAVENOUS

## 2022-11-25 MED ORDER — FENTANYL CITRATE (PF) 100 MCG/2ML IJ SOLN
INTRAMUSCULAR | Status: DC | PRN
  Administered 2022-11-25: 100 ug via INTRAVENOUS

## 2022-11-25 MED ORDER — OXYMETAZOLINE HCL 0.05 % NA SOLN
2.0000 | NASAL | Status: DC
  Administered 2022-11-25: 2 via NASAL

## 2022-11-25 MED ORDER — ACETAMINOPHEN 500 MG PO TABS
1000.0000 mg | ORAL_TABLET | Freq: Once | ORAL | Status: AC
  Administered 2022-11-25: 1000 mg via ORAL

## 2022-11-25 MED ORDER — LIDOCAINE-EPINEPHRINE 1 %-1:100000 IJ SOLN
INTRAMUSCULAR | Status: AC
  Filled 2022-11-25: qty 1

## 2022-11-25 MED ORDER — ROCURONIUM BROMIDE 10 MG/ML (PF) SYRINGE
PREFILLED_SYRINGE | INTRAVENOUS | Status: AC
  Filled 2022-11-25: qty 10

## 2022-11-25 MED ORDER — TRAMADOL HCL 50 MG PO TABS: 50.0000 mg | ORAL_TABLET | Freq: Four times a day (QID) | ORAL | 0 refills | Status: AC | PRN

## 2022-11-25 MED ORDER — LIDOCAINE-EPINEPHRINE 1 %-1:100000 IJ SOLN
INTRAMUSCULAR | Status: DC | PRN
  Administered 2022-11-25: 4 mL

## 2022-11-25 MED ORDER — DEXAMETHASONE SODIUM PHOSPHATE 10 MG/ML IJ SOLN
INTRAMUSCULAR | Status: DC | PRN
Start: 2022-11-25 — End: 2022-11-25
  Administered 2022-11-25: 10 mg via INTRAVENOUS

## 2022-11-25 MED ORDER — ONDANSETRON HCL 4 MG/2ML IJ SOLN
INTRAMUSCULAR | Status: DC | PRN
  Administered 2022-11-25: 4 mg via INTRAVENOUS

## 2022-11-25 MED ORDER — OXYMETAZOLINE HCL 0.05 % NA SOLN
NASAL | Status: DC | PRN
  Administered 2022-11-25: 1 via TOPICAL

## 2022-11-25 MED ORDER — DEXMEDETOMIDINE HCL IN NACL 80 MCG/20ML IV SOLN
INTRAVENOUS | Status: DC | PRN
Start: 2022-11-25 — End: 2022-11-25
  Administered 2022-11-25 (×2): 12 ug via INTRAVENOUS

## 2022-11-25 MED ORDER — PROPOFOL 10 MG/ML IV BOLUS
INTRAVENOUS | Status: DC | PRN
  Administered 2022-11-25: 50 mg via INTRAVENOUS
  Administered 2022-11-25: 200 mg via INTRAVENOUS

## 2022-11-25 SURGICAL SUPPLY — 32 items
ATTRACTOMAT 16X20 MAGNETIC DRP (DRAPES) IMPLANT
CANISTER SUCT 1200ML W/VALVE (MISCELLANEOUS) ×2 IMPLANT
COAGULATOR SUCT 8FR VV (MISCELLANEOUS) IMPLANT
DRSG NASOPORE 8CM (GAUZE/BANDAGES/DRESSINGS) IMPLANT
DRSG TELFA 3X8 NADH STRL (GAUZE/BANDAGES/DRESSINGS) IMPLANT
ELECT REM PT RETURN 9FT ADLT (ELECTROSURGICAL) ×1
ELECTRODE REM PT RTRN 9FT ADLT (ELECTROSURGICAL) ×2 IMPLANT
GAUZE 4X4 16PLY ~~LOC~~+RFID DBL (SPONGE) IMPLANT
GAUZE SPONGE 2X2 STRL 8-PLY (GAUZE/BANDAGES/DRESSINGS) ×2 IMPLANT
GLOVE BIOGEL PI IND STRL 7.0 (GLOVE) IMPLANT
GLOVE ECLIPSE 7.5 STRL STRAW (GLOVE) ×2 IMPLANT
GLOVE SURG SS PI 7.0 STRL IVOR (GLOVE) IMPLANT
GOWN STRL REUS W/ TWL LRG LVL3 (GOWN DISPOSABLE) ×4 IMPLANT
GOWN STRL REUS W/TWL LRG LVL3 (GOWN DISPOSABLE) ×3
HEMOSTAT SURGICEL .5X2 ABSORB (HEMOSTASIS) IMPLANT
NDL HYPO 27GX1-1/4 (NEEDLE) ×2 IMPLANT
NEEDLE HYPO 27GX1-1/4 (NEEDLE) ×1
NS IRRIG 1000ML POUR BTL (IV SOLUTION) IMPLANT
PACK BASIN DAY SURGERY FS (CUSTOM PROCEDURE TRAY) ×2 IMPLANT
PACK ENT DAY SURGERY (CUSTOM PROCEDURE TRAY) ×2 IMPLANT
PATTIES SURGICAL .5 X3 (DISPOSABLE) ×2 IMPLANT
SHEET SILICONE 2X3 0.03 REINF (MISCELLANEOUS) IMPLANT
SLEEVE SCD COMPRESS KNEE MED (STOCKING) ×2 IMPLANT
SPIKE FLUID TRANSFER (MISCELLANEOUS) IMPLANT
STRIP CLOSURE SKIN 1/2X4 (GAUZE/BANDAGES/DRESSINGS) IMPLANT
SUT CHROMIC 4 0 P 3 18 (SUTURE) ×2 IMPLANT
SUT ETHILON 3 0 PS 1 (SUTURE) IMPLANT
SUT ETHILON 4 0 CL P 3 (SUTURE) IMPLANT
SUT ETHILON 6 0 P 1 (SUTURE) IMPLANT
SUT PLAIN 4 0 ~~LOC~~ 1 (SUTURE) ×2 IMPLANT
TOWEL GREEN STERILE FF (TOWEL DISPOSABLE) ×2 IMPLANT
YANKAUER SUCT BULB TIP NO VENT (SUCTIONS) ×2 IMPLANT

## 2022-11-25 NOTE — Op Note (Signed)
OPERATIVE REPORT  DATE OF SURGERY: 11/25/2022  PATIENT:  Glenda Rich,  27 y.o. female  PRE-OPERATIVE DIAGNOSIS:  Deviated nasal septum; Chronic rhinitis; Nasal turbinate hypertrophy  POST-OPERATIVE DIAGNOSIS:  Deviated nasal septum; Chronic rhinitis; Nasal turbinate hypertrophy  PROCEDURE:  Procedure(s): NASAL SEPTOPLASTY WITH TURBINATE REDUCTION, bilateral  SURGEON:  Susy Frizzle, MD  ASSISTANTS: none  ANESTHESIA:   General   EBL:  75 ml  DRAINS: none  LOCAL MEDICATIONS USED:  1% Xylocaine with epinephrine  SPECIMEN:  none  COUNTS:  Correct  PROCEDURE DETAILS: The patient was taken to the operating room and placed on the operating table in the supine position. Following induction of general endotracheal anesthesia, the nose was prepped and draped in a standard fashion. Afrin spray was used preoperatively in the holding area. 1% Xylocaine with epinephrine was infiltrated into the septum, the columella, and the inferior turbinates bilaterally.  1. Nasal septoplasty. A left hemitransfixion incision was created with a 15 scalpel. A mucoperichondrial flap was developed posteriorly down the left side of the nasal septum using a Cottle elevator. This was performed all the way to the sphenoid rostrum. The bony cartilaginous junction was divided and a similar flap was developed down the right side. The ethmoid plate was "S-shaped" and was mostly resected.  The quadrangular cartilage was draped over the left side of the maxillary crest and the maxillary crest was angled to the left.  All of this was resected with an osteotome.  2. Submucous resection inferior turbinates bilaterally. The leading edge of the inferior turbinates were incised in a vertical fashion with a #15 scalpel. The Cottle elevator was used to elevate mucosa off bone in all directions. Fragments of the turbinate bone were resected with a Takahashi forceps. The turbinate remnants were outfractured with the Market researcher.  All of these maneuvers greatly increased the nasal airways bilaterally. The nasal cavities were packed with rolled up Telfa coated with bacitracin ointment. The pharynx was suctioned of blood and secretions under direct visualization. The patient was awakened extubated and transferred to recovery in stable condition.    PATIENT DISPOSITION:  To PACU, stable

## 2022-11-25 NOTE — Anesthesia Preprocedure Evaluation (Addendum)
Anesthesia Evaluation  Patient identified by MRN, date of birth, ID band Patient awake    Reviewed: Allergy & Precautions, NPO status , Patient's Chart, lab work & pertinent test results  Airway Mallampati: II  TM Distance: >3 FB Neck ROM: Full    Dental no notable dental hx.    Pulmonary neg pulmonary ROS   Pulmonary exam normal        Cardiovascular negative cardio ROS  Rhythm:Regular Rate:Normal     Neuro/Psych     Bipolar Disorder   negative neurological ROS     GI/Hepatic negative GI ROS, Neg liver ROS,,,  Endo/Other  negative endocrine ROS    Renal/GU negative Renal ROS  negative genitourinary   Musculoskeletal   Abdominal Normal abdominal exam  (+)   Peds  Hematology negative hematology ROS (+)   Anesthesia Other Findings   Reproductive/Obstetrics                             Anesthesia Physical Anesthesia Plan  ASA: 2  Anesthesia Plan: General   Post-op Pain Management: Tylenol PO (pre-op)*   Induction: Intravenous  PONV Risk Score and Plan: 3 and Ondansetron, Dexamethasone, Treatment may vary due to age or medical condition and Midazolam  Airway Management Planned: Mask and Oral ETT  Additional Equipment: None  Intra-op Plan:   Post-operative Plan: Extubation in OR  Informed Consent: I have reviewed the patients History and Physical, chart, labs and discussed the procedure including the risks, benefits and alternatives for the proposed anesthesia with the patient or authorized representative who has indicated his/her understanding and acceptance.     Dental advisory given  Plan Discussed with:   Anesthesia Plan Comments:        Anesthesia Quick Evaluation

## 2022-11-25 NOTE — Transfer of Care (Signed)
Immediate Anesthesia Transfer of Care Note  Patient: Glenda Rich  Procedure(s) Performed: NASAL SEPTOPLASTY WITH TURBINATE REDUCTION (Bilateral: Nose)  Patient Location: PACU  Anesthesia Type:General  Level of Consciousness: awake and alert   Airway & Oxygen Therapy: Patient Spontanous Breathing and Patient connected to face mask oxygen  Post-op Assessment: Report given to RN and Post -op Vital signs reviewed and stable  Post vital signs: Reviewed and stable  Last Vitals:  Vitals Value Taken Time  BP 119/65 11/25/22 1015  Temp    Pulse 80 11/25/22 1020  Resp 15 11/25/22 1020  SpO2 96 % 11/25/22 1020  Vitals shown include unfiled device data.  Last Pain:  Vitals:   11/25/22 1020  TempSrc:   PainSc: 4       Patients Stated Pain Goal: 3 (11/25/22 0824)  Complications: No notable events documented.

## 2022-11-25 NOTE — Anesthesia Procedure Notes (Signed)
Procedure Name: Intubation Date/Time: 11/25/2022 9:14 AM  Performed by: Karen Kitchens, CRNAPre-anesthesia Checklist: Patient identified, Emergency Drugs available, Suction available and Patient being monitored Patient Re-evaluated:Patient Re-evaluated prior to induction Oxygen Delivery Method: Circle system utilized Preoxygenation: Pre-oxygenation with 100% oxygen Induction Type: IV induction Ventilation: Mask ventilation without difficulty Laryngoscope Size: Mac and 4 Grade View: Grade I Tube type: Oral Tube size: 7.0 mm Number of attempts: 1 Airway Equipment and Method: Stylet and Oral airway Placement Confirmation: ETT inserted through vocal cords under direct vision, positive ETCO2, breath sounds checked- equal and bilateral and CO2 detector Secured at: 23 cm Tube secured with: Tape Dental Injury: Teeth and Oropharynx as per pre-operative assessment

## 2022-11-25 NOTE — Interval H&P Note (Signed)
History and Physical Interval Note:  11/25/2022 8:47 AM  [name]  has presented today for surgery, with the diagnosis of Deviated nasal septum; Chronic rhinitis; Nasal turbinate hypertrophy.  The various methods of treatment have been discussed with the patient and family. After consideration of risks, benefits and other options for treatment, the patient has consented to  Procedure(s): NASAL SEPTOPLASTY WITH TURBINATE REDUCTION (Bilateral) as a surgical intervention.  The patient's history has been reviewed, patient examined, no change in status, stable for surgery.  I have reviewed the patient's chart and labs.  Questions were answered to the patient's satisfaction.     Glenda Rich

## 2022-11-25 NOTE — Discharge Instructions (Addendum)
  Post Anesthesia Home Care Instructions  Activity: Get plenty of rest for the remainder of the day. A responsible individual must stay with you for 24 hours following the procedure.  For the next 24 hours, DO NOT: -Drive a car -Advertising copywriter -Drink alcoholic beverages -Take any medication unless instructed by your physician -Make any legal decisions or sign important papers.  Meals: Start with liquid foods such as gelatin or soup. Progress to regular foods as tolerated. Avoid greasy, spicy, heavy foods. If nausea and/or vomiting occur, drink only clear liquids until the nausea and/or vomiting subsides. Call your physician if vomiting continues.  Special Instructions/Symptoms: Your throat may feel dry or sore from the anesthesia or the breathing tube placed in your throat during surgery. If this causes discomfort, gargle with warm salt water. The discomfort should disappear within 24 hours.  If you had a scopolamine patch placed behind your ear for the management of post- operative nausea and/or vomiting:  1. The medication in the patch is effective for 72 hours, after which it should be removed.  Wrap patch in a tissue and discard in the trash. Wash hands thoroughly with soap and water. 2. You may remove the patch earlier than 72 hours if you experience unpleasant side effects which may include dry mouth, dizziness or visual disturbances. 3. Avoid touching the patch. Wash your hands with soap and water after contact with the patch.    Next dose of tylenol will be at 2:43pm

## 2022-11-25 NOTE — Anesthesia Postprocedure Evaluation (Signed)
Anesthesia Post Note  Patient: Glenda Rich  Procedure(s) Performed: NASAL SEPTOPLASTY WITH TURBINATE REDUCTION (Bilateral: Nose)     Patient location during evaluation: PACU Anesthesia Type: General Level of consciousness: awake and alert Pain management: pain level controlled Vital Signs Assessment: post-procedure vital signs reviewed and stable Respiratory status: spontaneous breathing, nonlabored ventilation, respiratory function stable and patient connected to nasal cannula oxygen Cardiovascular status: blood pressure returned to baseline and stable Postop Assessment: no apparent nausea or vomiting Anesthetic complications: no   No notable events documented.  Last Vitals:  Vitals:   11/25/22 1057 11/25/22 1109  BP: 125/72 125/81  Pulse: 73 76  Resp: 11 18  Temp:  (!) 36.3 C  SpO2: 96% 96%    Last Pain:  Vitals:   11/25/22 1109  TempSrc:   PainSc: 0-No pain                 Earl Lites P Faysal Fenoglio

## 2022-11-26 ENCOUNTER — Encounter (HOSPITAL_BASED_OUTPATIENT_CLINIC_OR_DEPARTMENT_OTHER): Payer: Self-pay | Admitting: Otolaryngology

## 2022-12-19 NOTE — Plan of Care (Signed)
[facility] Tonsillectomy/Adenoidectomy, Postoperative PEDS care plan entered in error.

## 2023-02-24 ENCOUNTER — Encounter: Payer: BC Managed Care – PPO | Admitting: Internal Medicine

## 2023-03-06 ENCOUNTER — Encounter: Payer: Self-pay | Admitting: Internal Medicine

## 2023-03-06 ENCOUNTER — Ambulatory Visit: Payer: 59 | Attending: Internal Medicine | Admitting: Internal Medicine

## 2023-03-06 VITALS — BP 126/89 | HR 80 | Resp 12 | Ht 65.75 in | Wt 243.0 lb

## 2023-03-06 DIAGNOSIS — R231 Pallor: Secondary | ICD-10-CM | POA: Insufficient documentation

## 2023-03-06 DIAGNOSIS — R768 Other specified abnormal immunological findings in serum: Secondary | ICD-10-CM | POA: Diagnosis not present

## 2023-03-06 DIAGNOSIS — M722 Plantar fascial fibromatosis: Secondary | ICD-10-CM | POA: Diagnosis not present

## 2023-03-06 NOTE — Progress Notes (Signed)
 Office Visit Note  Patient: Glenda Rich             Date of Birth: Aug 25, 1995           MRN: [id number]             PCP: Wendee Lynwood CHRISTELLA, NP Referring: Wendee Lynwood CHRISTELLA, NP Visit Date: 03/06/2023 Occupation: Housekeeping  Subjective:  New Patient (Initial Visit) (Abnormal Labs. Bilateral knee pain. )   Discussed the use of AI scribe software for clinical note transcription with the patient, who gave verbal consent to proceed.  History of Present Illness   Glenda Rich is a 28 y.o. female here for evaluation of positive ANA checked in associated with joint pains and family history of lupus. She presented with concerns about a low positive ANA test result. They reported a history of joint pain, particularly in the knees and feet, which has been ongoing for approximately a year. The pain is described as constant, exacerbated by walking and pressure, and is not associated with any specific injury. The patient also reported a history of plantar fasciitis and tendonitis, which has been a lifelong issue, and had surgery to address these conditions.  The patient works as a advertising copywriter and noted that certain repetitive movements, such as lifting chairs, cause discomfort in the hand. They also reported experiencing a 'zap' of pain on the sides of the knees. The patient has noticed color changes in the toes, turning a purplish color, particularly when transitioning from a resting to a standing position.  The patient has been managing the joint pain with Tylenol  approximately twice a week. They also reported engaging in foot and leg stretches in the morning to improve circulation and alleviate symptoms. The patient has not reported any skin rashes or discoloration, abnormal bleeding, lymph node swelling, or significant medication use for the joint pain or inflammation.  The patient also reported a history of surgery for nasal issues and uses a wedge body pillow for support. They have been trying new  activities such as crocheting, which has caused some discomfort in the hand. 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 They reported some difficulty with certain exercises due to limited ankle dorsiflexion and knee pain.  In summary, the patient presents with a history of joint pain, particularly in the knees and feet, with a low positive ANA test result. They have a family history of autoimmune diseases and are concerned about the possibility of developing a similar condition. The patient is managing their symptoms with over-the-counter pain medication and physical exercises.     04/2022 ANA 1:40 speckled  Activities of Daily Living:  Patient reports morning stiffness for 5-10 minutes.   Patient Denies nocturnal pain.  Difficulty dressing/grooming: Denies Difficulty climbing stairs: Denies Difficulty getting out of chair: Denies Difficulty using hands for taps, buttons, cutlery, and/or writing: Denies  Review of Systems  Constitutional:  Negative for fatigue.  HENT:  Negative for mouth sores and mouth dryness.   Eyes:  Positive for dryness.  Respiratory:  Negative for shortness of breath.   Cardiovascular:  Negative for chest pain and palpitations.  Gastrointestinal:  Negative for blood in stool, constipation and diarrhea.  Endocrine: Negative for increased urination.  Genitourinary:  Negative for involuntary urination.  Musculoskeletal:  Positive for gait problem and morning stiffness. Negative for joint pain, joint pain, joint swelling, myalgias, muscle weakness, muscle tenderness and myalgias.  Skin:  Positive for  color change. Negative for rash, hair loss and sensitivity to sunlight.  Allergic/Immunologic: Negative for susceptible to infections.  Neurological:  Negative for dizziness and headaches.  Hematological:  Negative for swollen glands.  Psychiatric/Behavioral:  Positive for depressed mood. Negative for sleep disturbance. The  patient is nervous/anxious.     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   Plantar fasciitis, bilateral 03/06/2023   Preventative health care 06/26/2022   Positive ANA (antinuclear antibody) 05/10/2022   Obesity (BMI 30-39.9) 05/03/2022   Pain in joint, multiple sites 05/03/2022   Family history of systemic lupus erythematosus 05/03/2022   Family history of diabetes mellitus 05/03/2022   Family history of thyroid  disease 05/03/2022   Chronic rhinitis 08/06/2021   Deviated nasal septum 08/06/2021    Past Medical History:  Diagnosis Date   ADHD    Anxiety    Bipolar disorder (HCC)    Depression    Plantar fasciitis, bilateral    Wears glasses     Family History  Problem Relation Age of Onset   Fibrocystic breast disease Mother    Thyroid  disease Mother    Fibromyalgia Mother    Lupus Maternal Grandmother    Dementia Paternal Grandmother    Heart attack Paternal Grandfather        x2   Lupus Maternal Aunt    Asthma Half-Sister    Past Surgical History:  Procedure Laterality Date   LEG SURGERY     2020   NASAL SEPTOPLASTY W/ TURBINOPLASTY Bilateral 11/25/2022   Procedure: NASAL SEPTOPLASTY WITH TURBINATE REDUCTION;  Surgeon: Jesus Oliphant, MD;  Location: [facility];  Service: ENT;  Laterality: Bilateral;   Social History   Social History Narrative   Sttes that she has 2 jobs.      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 [employer]   Immunization History  Administered Date(s) Administered   PPD Test 05/04/2021   Tdap 06/26/2022     Objective: Vital Signs: BP 126/89 (BP Location: Right Arm, Patient Position: Sitting, Cuff Size: Large)   Pulse 80   Resp 12   Ht 5' 5.75 (1.67 m)   Wt 243 lb (110.2 kg)   BMI 39.52 kg/m    Physical Exam Eyes:     Conjunctiva/sclera: Conjunctivae normal.  Cardiovascular:     Rate and Rhythm:  Normal rate and regular rhythm.  Pulmonary:     Effort: Pulmonary effort is normal.     Breath sounds: Normal breath sounds.  Lymphadenopathy:     Cervical: No cervical adenopathy.  Skin:    General: Skin is warm and dry.     Comments: Livedo reticularis present at forearm and lower legs, blanching, no overlying ulceration or skin lesions Violaceous discoloration in the tips of multiple toes, no pitting no skin peeling or ulceration Normal capillary refill less than 2 seconds Normal-appearing nailfold capillaroscopy  Neurological:     Mental Status: She is alert.  Psychiatric:        Mood and Affect: Mood normal.      Musculoskeletal Exam:  Shoulders full ROM no tenderness or swelling Elbows full ROM no tenderness or swelling Wrists full ROM no tenderness or swelling Fingers full ROM no tenderness or swelling Knees full ROM no tenderness or swelling Ankles full ROM no tenderness or swelling   Investigation: No additional findings.  Imaging:  No results found.  Recent Labs: Lab Results  Component Value Date   WBC 9.6 05/03/2022   HGB 14.1 05/03/2022   PLT 343.0 05/03/2022   NA 138 05/03/2022   K 4.2 05/03/2022   CL 102 05/03/2022   CO2 29 05/03/2022   GLUCOSE 84 05/03/2022   BUN 8 05/03/2022   CREATININE 0.71 05/03/2022   BILITOT 0.7 05/03/2022   ALKPHOS 88 05/03/2022   AST 16 05/03/2022   ALT 19 05/03/2022   PROT 6.7 05/03/2022   ALBUMIN 4.1 05/03/2022   CALCIUM 9.4 05/03/2022    Speciality Comments: No specialty comments available.  Procedures:  No procedures performed Allergies: Patient has no known allergies.   Assessment / Plan:     Visit Diagnoses: Positive ANA (antinuclear antibody) - Plan: Sedimentation rate, C3 and C4, RNP Antibody, Anti-Smith antibody, Sjogrens syndrome-A extractable nuclear antibody, Sjogrens syndrome-B extractable nuclear antibody, Anti-DNA antibody, double-stranded Low positive ANA with family history of autoimmune disease.  Discussed the significance of ANA and its lack of specificity for a particular disease. No current evidence of inflammation or autoimmune disease. -Order additional antibody tests as detailed above and inflammation markers (sedimentation rate and complements) for further evaluation.  Livedo reticularis Redness and discoloration of skin, likely due to cold exposure and possible medication effects. No current inflammation or rash. -No specific intervention needed at this time.  Plantar fasciitis, bilateral Chronic plantar fasciitis with daily symptoms. Limited ankle dorsiflexion likely contributing to ongoing symptoms. -Continue current management with foot stretches and exercises. -Consider modifications for exercises to improve range of motion.   Follow-up Pending results of additional blood work. If results are positive, follow-up to discuss significance and potential need for monitoring. If symptoms change significantly, retesting may be necessary.     Orders: Orders Placed This Encounter  Procedures   Sedimentation rate   C3 and C4   RNP Antibody   Anti-Smith antibody   Sjogrens syndrome-A extractable nuclear antibody   Sjogrens syndrome-B extractable nuclear antibody   Anti-DNA antibody, double-stranded   No orders of the defined types were placed in this encounter.   Follow-Up Instructions: No follow-ups on file.   Lonni LELON Ester, MD  Note - This record has been created using [employer].  Chart creation errors have been sought, but may not always  have been located. Such creation errors do not reflect on  the standard of medical care.

## 2023-03-09 LAB — SJOGRENS SYNDROME-A EXTRACTABLE NUCLEAR ANTIBODY: SSA (Ro) (ENA) Antibody, IgG: <1 AI

## 2023-03-09 LAB — ANTI-DNA ANTIBODY, DOUBLE-STRANDED: ds DNA Ab: <1 [IU]/mL

## 2023-03-09 LAB — SJOGRENS SYNDROME-B EXTRACTABLE NUCLEAR ANTIBODY: SSB (La) (ENA) Antibody, IgG: <1 AI

## 2023-03-09 LAB — RNP ANTIBODY: Ribonucleic Protein(ENA) Antibody, IgG: <1 AI

## 2023-03-09 LAB — C3 AND C4
C3 Complement: 150 mg/dL (ref 83–193)
C4 Complement: 22 mg/dL (ref 15–57)

## 2023-03-09 LAB — ANTI-SMITH ANTIBODY: ENA SM Ab Ser-aCnc: <1 AI

## 2023-03-09 LAB — SEDIMENTATION RATE: Sed Rate: 9 mm/h (ref 0–20)

## 2023-03-19 ENCOUNTER — Encounter: Payer: Self-pay | Admitting: Internal Medicine

## 2023-03-27 ENCOUNTER — Encounter: Payer: BC Managed Care – PPO | Admitting: Internal Medicine

## 2023-04-22 ENCOUNTER — Ambulatory Visit: Payer: 59 | Admitting: Obstetrics & Gynecology

## 2023-04-22 ENCOUNTER — Other Ambulatory Visit (HOSPITAL_COMMUNITY)
Admission: RE | Admit: 2023-04-22 | Discharge: 2023-04-22 | Disposition: A | Discharge status: Home / Self Care | Payer: 59 | Source: Ambulatory Visit | Attending: Obstetrics & Gynecology | Admitting: Obstetrics & Gynecology

## 2023-04-22 ENCOUNTER — Encounter: Payer: Self-pay | Admitting: Obstetrics & Gynecology

## 2023-04-22 VITALS — BP 116/77 | HR 73 | Ht 65.0 in | Wt 238.0 lb

## 2023-04-22 DIAGNOSIS — Z1151 Encounter for screening for human papillomavirus (HPV): Secondary | ICD-10-CM | POA: Diagnosis not present

## 2023-04-22 DIAGNOSIS — Z3046 Encounter for surveillance of implantable subdermal contraceptive: Secondary | ICD-10-CM | POA: Diagnosis not present

## 2023-04-22 DIAGNOSIS — Z01419 Encounter for gynecological examination (general) (routine) without abnormal findings: Secondary | ICD-10-CM

## 2023-04-22 MED ORDER — ETONOGESTREL 68 MG ~~LOC~~ IMPL
68.0000 mg | DRUG_IMPLANT | Freq: Once | SUBCUTANEOUS | Status: AC
  Administered 2023-04-22: 68 mg via SUBCUTANEOUS

## 2023-04-22 NOTE — Progress Notes (Signed)
 GYNECOLOGY ANNUAL PREVENTATIVE CARE ENCOUNTER NOTE  History:     Glenda Rich is a 28 y.o. G0P0000 female here for a routine annual gynecologic exam and Nexplanon replacement. Nexplanon was replaced on 04/10/2020, she has no concerns about this.   Denies abnormal vaginal bleeding, discharge, pelvic pain, problems with intercourse or other gynecologic concerns.    Gynecologic History Patient's last menstrual period was 04/17/2023. Contraception: Nexplanon Last Pap: 04/10/2020. Results were: normal  Obstetric History OB History  Gravida Para Term Preterm AB Living  0 0 0 0 0 0  SAB IAB Ectopic Multiple Live Births  0 0 0 0 0    Past Medical History:  Diagnosis Date   ADHD    Anxiety    Bipolar disorder (HCC)    Depression    Plantar fasciitis, bilateral    Wears glasses     Past Surgical History:  Procedure Laterality Date   LEG SURGERY     2020   NASAL SEPTOPLASTY W/ TURBINOPLASTY Bilateral 11/25/2022   Procedure: NASAL SEPTOPLASTY WITH TURBINATE REDUCTION;  Surgeon: Serena Colonel, MD;  Location: [facility];  Service: ENT;  Laterality: Bilateral;    Current Outpatient Medications on File Prior to Visit  Medication Sig Dispense Refill   amphetamine-dextroamphetamine (ADDERALL XR) 25 MG 24 hr capsule Take by mouth daily.     lamoTRIgine (LAMICTAL) 200 MG tablet Take 200 mg by mouth 2 (two) times daily.     amphetamine-dextroamphetamine (ADDERALL XR) 10 MG 24 hr capsule Take 10 mg by mouth every morning. (Patient not taking: Reported on 04/22/2023)     ondansetron (ZOFRAN-ODT) 4 MG disintegrating tablet Take 1 tablet (4 mg total) by mouth every 8 (eight) hours as needed for nausea or vomiting. (Patient not taking: Reported on 04/22/2023) 20 tablet 0   traMADol (ULTRAM) 50 MG tablet Take 1 tablet (50 mg total) by mouth every 6 (six) hours as needed. (Patient not taking: Reported on 04/22/2023) 20 tablet 0   No current facility-administered medications on file  prior to visit.    No Known Allergies  Social History:  reports that she has never smoked. She has been exposed to tobacco smoke. She has never used smokeless tobacco. She reports that she does not drink alcohol and does not use drugs.  Family History  Problem Relation Age of Onset   Fibrocystic breast disease Mother    Thyroid disease Mother    Fibromyalgia Mother    Hypertension Maternal Grandmother    Lupus Maternal Grandmother    Dementia Paternal Grandmother    Heart attack Paternal Grandfather        x2   Lupus Maternal Aunt    Asthma Half-Sister     The following portions of the patient's history were reviewed and updated as appropriate: allergies, current medications, past family history, past medical history, past social history, past surgical history and problem list.  Review of Systems Pertinent items noted in HPI and remainder of comprehensive ROS otherwise negative.  Physical Exam:  BP 116/77   Pulse 73   Ht 5\' 5"  (1.651 m)   Wt 238 lb (108 kg)   LMP 04/17/2023   BMI 39.61 kg/m  CONSTITUTIONAL: Well-developed, well-nourished female in no acute distress.  HENT:  Normocephalic, atraumatic, External right and left ear normal.  EYES: Conjunctivae and EOM are normal. Pupils are equal, round, and reactive to light. No scleral icterus.  NECK: Normal range of motion, supple, no masses.  Normal thyroid.  SKIN:  Skin is warm and dry. No rash noted. Not diaphoretic. No erythema. No pallor. MUSCULOSKELETAL: Normal range of motion. No tenderness.  No cyanosis, clubbing, or edema. NEUROLOGIC: Alert and oriented to person, place, and time. Normal reflexes, muscle tone coordination.  PSYCHIATRIC: Normal mood and affect. Normal behavior. Normal judgment and thought content. CARDIOVASCULAR: Normal heart rate noted, regular rhythm RESPIRATORY: Clear to auscultation bilaterally. Effort and breath sounds normal, no problems with respiration noted. BREASTS: Symmetric in size. No  masses, tenderness, skin changes, nipple drainage, or lymphadenopathy bilaterally. Performed in the presence of a chaperone. ABDOMEN: Soft, obese, no distention noted.  No tenderness, rebound or guarding.  PELVIC: Normal appearing external genitalia and urethral meatus; normal appearing vaginal mucosa and cervix.  Currently on period, used four fox swabs to wipe off blood.  Pap smear obtained.  Normal uterine size, no other palpable masses, no uterine or adnexal tenderness.  Performed in the presence of a chaperone.  Nexplanon Removal and Reinsertion Patient identified, informed consent performed, consent signed.   Patient does understand that irregular bleeding is a very common side effect of this medication. She was advised to have backup contraception for one week after replacement of the implant.  Appropriate time out taken. Nexplanon site identified in left arm.  Area prepped in usual sterile fashon. 1 ml of 1% lidocaine was used to anesthetize the area at the distal end of the implant. A small stab incision was made right beside the implant on the distal portion. The Nexplanon rod was grasped using hemostats and removed without difficulty. There was minimal blood loss. There were no complications. Area was then injected with 3 ml of 1 % lidocaine. She was re-prepped with betadine, Nexplanon removed from packaging, Device confirmed in needle, then inserted full length of needle and withdrawn per handbook instructions. Nexplanon was able to palpated in the patient's arm; patient palpated the insert herself.  There was minimal blood loss. Patient insertion site covered with gauze and a pressure bandage to reduce any bruising. The patient tolerated the procedure well and was given post procedure instructions.    Assessment and Plan:      1. Encounter for removal and reinsertion of Nexplanon - Etonogestrel (NEXPLANON) implant 68 mg replaced today. No complications. Patient is aware she can keep this in  for four years.  2. Well woman exam with routine gynecological exam - Cytology - PAP( [facility]) Will follow up results of pap smear and manage accordingly. Routine preventative health maintenance measures emphasized. Please refer to After Visit Summary for other counseling recommendations.      Jaynie Collins, MD, FACOG Obstetrician & Gynecologist, [facility] [employer], [facility]

## 2023-04-22 NOTE — Patient Instructions (Signed)
 Nexplanon Instructions After Insertion  Keep bandage clean and dry for 24 hours  May use ice/Tylenol/Ibuprofen for soreness or pain  If you develop fever, drainage or increased warmth from incision site-contact office immediately

## 2023-04-24 ENCOUNTER — Encounter: Payer: Self-pay | Admitting: Obstetrics & Gynecology

## 2023-04-24 LAB — CYTOLOGY - PAP
Comment: NEGATIVE
Diagnosis: NEGATIVE
High risk HPV: NEGATIVE
